# Patient Record
Sex: Female | Born: 1980 | Race: Black or African American | Hispanic: No | Marital: Single | Smoking: Never smoker
Health system: Southern US, Community
[De-identification: ages and names within clinical notes are randomized; demographics above are authoritative.]

## PROBLEM LIST (undated history)

## (undated) DIAGNOSIS — Z803 Family history of malignant neoplasm of breast: Secondary | ICD-10-CM

## (undated) DIAGNOSIS — D649 Anemia, unspecified: Secondary | ICD-10-CM

## (undated) DIAGNOSIS — N83209 Unspecified ovarian cyst, unspecified side: Secondary | ICD-10-CM

## (undated) DIAGNOSIS — N920 Excessive and frequent menstruation with regular cycle: Secondary | ICD-10-CM

## (undated) DIAGNOSIS — A64 Unspecified sexually transmitted disease: Secondary | ICD-10-CM

## (undated) DIAGNOSIS — Z8481 Family history of carrier of genetic disease: Secondary | ICD-10-CM

## (undated) HISTORY — PX: TUBAL LIGATION: SHX77

## (undated) HISTORY — DX: Family history of carrier of genetic disease: Z84.81

## (undated) HISTORY — DX: Family history of malignant neoplasm of breast: Z80.3

## (undated) HISTORY — DX: Unspecified sexually transmitted disease: A64

## (undated) HISTORY — DX: Excessive and frequent menstruation with regular cycle: N92.0

## (undated) HISTORY — DX: Unspecified ovarian cyst, unspecified side: N83.209

## (undated) HISTORY — DX: Anemia, unspecified: D64.9

---

## 2004-08-08 ENCOUNTER — Emergency Department: Payer: Self-pay | Admitting: Internal Medicine

## 2005-01-28 ENCOUNTER — Emergency Department: Payer: Self-pay | Admitting: Emergency Medicine

## 2006-02-22 ENCOUNTER — Emergency Department: Payer: Self-pay | Admitting: Unknown Physician Specialty

## 2006-03-19 ENCOUNTER — Emergency Department: Payer: Self-pay | Admitting: Emergency Medicine

## 2008-06-22 ENCOUNTER — Emergency Department: Payer: Self-pay | Admitting: Emergency Medicine

## 2010-07-16 ENCOUNTER — Emergency Department: Payer: Self-pay | Admitting: Emergency Medicine

## 2011-12-01 ENCOUNTER — Emergency Department: Payer: Self-pay | Admitting: Emergency Medicine

## 2012-06-23 ENCOUNTER — Emergency Department: Payer: Self-pay | Admitting: Emergency Medicine

## 2012-07-19 ENCOUNTER — Emergency Department: Payer: Self-pay | Admitting: Emergency Medicine

## 2012-12-14 DIAGNOSIS — N63 Unspecified lump in unspecified breast: Secondary | ICD-10-CM | POA: Insufficient documentation

## 2012-12-16 ENCOUNTER — Ambulatory Visit: Payer: Self-pay | Admitting: Family Medicine

## 2013-01-04 DIAGNOSIS — H6690 Otitis media, unspecified, unspecified ear: Secondary | ICD-10-CM | POA: Insufficient documentation

## 2014-06-19 ENCOUNTER — Emergency Department: Payer: Self-pay | Admitting: Emergency Medicine

## 2014-11-11 DIAGNOSIS — H6092 Unspecified otitis externa, left ear: Secondary | ICD-10-CM | POA: Insufficient documentation

## 2014-11-11 NOTE — ED Notes (Addendum)
Pt c/o cough, left earache, sore throat and soreness to ribs and chest. Pain increased with deep breaths and cough. Symptoms started this AM.

## 2014-11-12 ENCOUNTER — Encounter: Payer: Self-pay | Admitting: Emergency Medicine

## 2014-11-12 ENCOUNTER — Other Ambulatory Visit: Payer: Self-pay

## 2014-11-12 ENCOUNTER — Emergency Department
Admission: EM | Admit: 2014-11-12 | Discharge: 2014-11-12 | Disposition: A | Discharge status: Home / Self Care | Payer: Self-pay | Attending: Emergency Medicine | Admitting: Emergency Medicine

## 2014-11-12 ENCOUNTER — Emergency Department: Payer: Self-pay

## 2014-11-12 DIAGNOSIS — R079 Chest pain, unspecified: Secondary | ICD-10-CM

## 2014-11-12 DIAGNOSIS — H6092 Unspecified otitis externa, left ear: Secondary | ICD-10-CM

## 2014-11-12 DIAGNOSIS — R059 Cough, unspecified: Secondary | ICD-10-CM

## 2014-11-12 DIAGNOSIS — R05 Cough: Secondary | ICD-10-CM

## 2014-11-12 MED ORDER — IPRATROPIUM-ALBUTEROL 0.5-2.5 (3) MG/3ML IN SOLN
3.0000 mL | Freq: Once | RESPIRATORY_TRACT | Status: AC
  Administered 2014-11-12: 3 mL via RESPIRATORY_TRACT

## 2014-11-12 MED ORDER — ALBUTEROL SULFATE HFA 108 (90 BASE) MCG/ACT IN AERS
2.0000 | INHALATION_SPRAY | Freq: Four times a day (QID) | RESPIRATORY_TRACT | Status: DC
  Administered 2014-11-12: 2 via RESPIRATORY_TRACT
  Filled 2014-11-12: qty 6.7

## 2014-11-12 MED ORDER — IPRATROPIUM-ALBUTEROL 0.5-2.5 (3) MG/3ML IN SOLN
RESPIRATORY_TRACT | Status: AC
  Filled 2014-11-12: qty 3

## 2014-11-12 MED ORDER — IBUPROFEN 800 MG PO TABS
ORAL_TABLET | ORAL | Status: AC
  Administered 2014-11-12: 800 mg
  Filled 2014-11-12: qty 1

## 2014-11-12 MED ORDER — OXYCODONE-ACETAMINOPHEN 5-325 MG PO TABS: 1.0000 | ORAL_TABLET | ORAL | Status: DC | PRN

## 2014-11-12 MED ORDER — AMOXICILLIN-POT CLAVULANATE 875-125 MG PO TABS: 1.0000 | ORAL_TABLET | Freq: Two times a day (BID) | ORAL | Status: AC

## 2014-11-12 MED ORDER — IBUPROFEN 800 MG PO TABS: 800.0000 mg | ORAL_TABLET | Freq: Three times a day (TID) | ORAL | Status: DC | PRN

## 2014-11-12 NOTE — ED Notes (Signed)
Pt sleeping. 

## 2014-11-12 NOTE — ED Notes (Signed)
Pt updated on wait time. Pt verbalizes undertstanding.

## 2014-11-12 NOTE — ED Notes (Signed)
Pt updated on wait time. Pt verbalizes understanding. Pt sleeping in room prior to awoken for vital signs and pain assessment. Pt's daughter at bedside watching tv.

## 2014-11-12 NOTE — Discharge Instructions (Signed)
Use the ear drops witht e wick as demonstrated. Remember use 4-5 drops then add 4-5 drops to the wick another 3 x a day, changing the wick every day.   You can use augmentin 1 pill 2 x a day with food if the eardrops do not help after 2-3 days or if the ear gets worse.   Use the albuterol inhaler 2 puffs 4 x a day to help the cough.  Return for fever, shortness of breath or any worse pain. You can try the pain pills 1 pill 4 x a day for 1-2 days if needed. Usually motrin 1 pill 3 x a day works well for this.  Follow up with your doctor in the next week.

## 2014-11-12 NOTE — ED Notes (Signed)
Pt to stat registration c/o increased pain with coughing-center of chest; only hurts with cough; increased shortness of breath; has been feeling bad since early Saturday morning; nonproductive cough;

## 2014-11-12 NOTE — ED Provider Notes (Signed)
[facility] Emergency Department Provider Note  ____________________________________________  Time seen: Approximately 6:19 AM  I have reviewed the triage vital signs and the nursing notes.   HISTORY  Chief Complaint URI    HPI Patricia Yu is a 34 y.o. female patient reports she's been coughing for several days but then began drinking pain in her chest only with coughing yesterday evening he denies shortness of breath fever chills pain with deep breathing or any other complaints the pain she has is in the center of her chest over the breastbone again only with coughing she said initially it was very severe when I saw her she said it just hurt a little patient has not been on any long trips does not take birth control pills does not smoke has not had any broken bones and is otherwise doing well     History reviewed. No pertinent past medical history.  There are no active problems to display for this patient.   History reviewed. No pertinent past surgical history.  No current outpatient prescriptions on file.  Allergies Review of patient's allergies indicates no known allergies.  History reviewed. No pertinent family history.  Social History History  Substance Use Topics  . Smoking status: Never Smoker   . Smokeless tobacco: Not on file  . Alcohol Use: No    Review of Systems Constitutional: No fever/chills Eyes: No visual changes. ENT: No sore throat. Cardiovascular: Denies chest pain. Respiratory: Denies shortness of breath. Gastrointestinal: No abdominal pain.  No nausea, no vomiting.  No diarrhea.  No constipation. Genitourinary: Negative for dysuria. Musculoskeletal: Negative for back pain. Skin: Negative for rash. Neurological: Negative for headaches, focal weakness or numbness.   10-point ROS otherwise negative.  ____________________________________________   PHYSICAL EXAM:  VITAL SIGNS: ED Triage Vitals  Enc Vitals  Group     BP 11/11/14 2217 144/89 mmHg     Pulse Rate 11/11/14 2217 94     Resp 11/11/14 2217 16     Temp 11/11/14 2217 98.4 F (36.9 C)     Temp Source 11/11/14 2217 Oral     SpO2 11/11/14 2217 98 %     Weight 11/11/14 2217 145 lb (65.772 kg)     Height 11/11/14 2217  (1.6 m)     Head Cir --      Peak Flow --      Pain Score 11/11/14 2217 10     Pain Loc --      Pain Edu? --      Excl. in GC? --     Constitutional: Alert and oriented. Well appearing and in no acute distress. Eyes: Conjunctivae are normal. PERRL. EOMI. Head: Atraumatic. Nose: No congestion/rhinnorhea. Mouth/Throat: Mucous membranes are moist.  Oropharynx non-erythematous. Patient complains of pain on traction of the left ear or palpation of the ear and ear canal seems slightly swollen on that side from his partial obscured by wax but appears to be normal the other ear is normal completely Neck: No stridor.   Hematological/Lymphatic/Immunilogical: No cervical lymphadenopathy. Cardiovascular: Normal rate, regular rhythm. Grossly normal heart sounds.  Good peripheral circulation. Respiratory: Normal respiratory effort.  No retractions. Lungs CTAB. Gastrointestinal: Soft and nontender. No distention. No abdominal bruits. No CVA tenderness. Musculoskeletal: No lower extremity tenderness nor edema.  No joint effusions. Palpation of the chest does not reproduce any pain Neurologic:  Normal speech and language. No gross focal neurologic deficits are appreciated. Speech is normal. No gait instability. Skin:  Skin  is warm, dry and intact. No rash noted. Psychiatric: Mood and affect are normal. Speech and behavior are normal.  ____________________________________________   LABS (all labs ordered are listed, but only abnormal results are displayed)  Labs Reviewed - No data to display ____________________________________________  EKG is read by me shows normal sinus rhythm at 87 normal axis normal intervals and  essentially normal EKG ____________________________________________  RADIOLOGY  Radiology repeat chest x-ray is normal ____________________________________________   PROCEDURES  Procedure(s) performed: None  Critical Care performed: No  ____________________________________________   INITIAL IMPRESSION / ASSESSMENT AND PLAN / ED COURSE  Pertinent labs & imaging results that were available during my care of the patient were reviewed by me and considered in my medical decision making (see chart for details).  Patient had a albuterol Atrovent nebulizer this seems to have helped cough patient will be discharged on albuterol inhaler with spacer given ear drops and some antibiotics for her ear ____________________________________________   FINAL CLINICAL IMPRESSION(S) / ED DIAGNOSES  Final diagnoses:  Cough  Chest pain     Arnaldo NatalPaul F Zaliah Wissner, MD 11/13/14 [phone]

## 2014-11-12 NOTE — ED Notes (Signed)
No wheezing ausculated after duoneb. Pt with improved coughing.

## 2015-12-27 ENCOUNTER — Ambulatory Visit: Payer: Self-pay

## 2015-12-27 ENCOUNTER — Other Ambulatory Visit: Payer: Self-pay | Admitting: Internal Medicine

## 2015-12-27 DIAGNOSIS — R519 Headache, unspecified: Secondary | ICD-10-CM

## 2015-12-27 DIAGNOSIS — R51 Headache: Principal | ICD-10-CM

## 2015-12-28 ENCOUNTER — Ambulatory Visit
Admission: RE | Admit: 2015-12-28 | Discharge: 2015-12-28 | Disposition: A | Discharge status: Home / Self Care | Payer: Self-pay | Source: Ambulatory Visit | Attending: Internal Medicine | Admitting: Internal Medicine

## 2015-12-28 ENCOUNTER — Other Ambulatory Visit: Payer: Self-pay | Admitting: Internal Medicine

## 2015-12-28 DIAGNOSIS — R51 Headache: Principal | ICD-10-CM

## 2015-12-28 DIAGNOSIS — R519 Headache, unspecified: Secondary | ICD-10-CM

## 2016-01-22 DIAGNOSIS — A64 Unspecified sexually transmitted disease: Secondary | ICD-10-CM

## 2016-01-22 HISTORY — DX: Unspecified sexually transmitted disease: A64

## 2016-02-03 ENCOUNTER — Encounter: Payer: Self-pay | Admitting: Emergency Medicine

## 2016-02-03 ENCOUNTER — Emergency Department: Payer: BLUE CROSS/BLUE SHIELD

## 2016-02-03 ENCOUNTER — Emergency Department
Admission: EM | Admit: 2016-02-03 | Discharge: 2016-02-03 | Disposition: A | Discharge status: Home / Self Care | Payer: BLUE CROSS/BLUE SHIELD | Attending: Emergency Medicine | Admitting: Emergency Medicine

## 2016-02-03 DIAGNOSIS — R102 Pelvic and perineal pain: Secondary | ICD-10-CM | POA: Diagnosis not present

## 2016-02-03 DIAGNOSIS — A599 Trichomoniasis, unspecified: Secondary | ICD-10-CM | POA: Insufficient documentation

## 2016-02-03 DIAGNOSIS — R1031 Right lower quadrant pain: Secondary | ICD-10-CM | POA: Diagnosis present

## 2016-02-03 LAB — CBC
HCT: 37 % (ref 35.0–47.0)
Hemoglobin: 12.5 g/dL (ref 12.0–16.0)
MCH: 26.3 pg (ref 26.0–34.0)
MCHC: 33.8 g/dL (ref 32.0–36.0)
MCV: 77.8 fL — ABNORMAL LOW (ref 80.0–100.0)
Platelets: 234 10*3/uL (ref 150–440)
RBC: 4.76 MIL/uL (ref 3.80–5.20)
RDW: 14.5 % (ref 11.5–14.5)
WBC: 4.4 10*3/uL (ref 3.6–11.0)

## 2016-02-03 LAB — CHLAMYDIA/NGC RT PCR (ARMC ONLY)
Chlamydia Tr: NOT DETECTED
N gonorrhoeae: NOT DETECTED

## 2016-02-03 LAB — WET PREP, GENITAL
Clue Cells Wet Prep HPF POC: NONE SEEN
Sperm: NONE SEEN
Yeast Wet Prep HPF POC: NONE SEEN

## 2016-02-03 LAB — URINALYSIS COMPLETE WITH MICROSCOPIC (ARMC ONLY)
Bacteria, UA: NONE SEEN
Bilirubin Urine: NEGATIVE
Glucose, UA: NEGATIVE mg/dL
Hgb urine dipstick: NEGATIVE
Ketones, ur: NEGATIVE mg/dL
Nitrite: NEGATIVE
Protein, ur: NEGATIVE mg/dL
Specific Gravity, Urine: 1.015 (ref 1.005–1.030)
pH: 7 (ref 5.0–8.0)

## 2016-02-03 LAB — COMPREHENSIVE METABOLIC PANEL
ALT: 12 U/L — ABNORMAL LOW (ref 14–54)
AST: 17 U/L (ref 15–41)
Albumin: 3.7 g/dL (ref 3.5–5.0)
Alkaline Phosphatase: 52 U/L (ref 38–126)
Anion gap: 7 (ref 5–15)
BUN: 14 mg/dL (ref 6–20)
CO2: 24 mmol/L (ref 22–32)
Calcium: 9.3 mg/dL (ref 8.9–10.3)
Chloride: 107 mmol/L (ref 101–111)
Creatinine, Ser: 1.03 mg/dL — ABNORMAL HIGH (ref 0.44–1.00)
GFR calc Af Amer: >60 mL/min (ref >60)
GFR calc non Af Amer: >60 mL/min (ref >60)
Glucose, Bld: 107 mg/dL — ABNORMAL HIGH (ref 65–99)
Potassium: 3.3 mmol/L — ABNORMAL LOW (ref 3.5–5.1)
Sodium: 138 mmol/L (ref 135–145)
Total Bilirubin: 0.4 mg/dL (ref 0.3–1.2)
Total Protein: 7.3 g/dL (ref 6.5–8.1)

## 2016-02-03 LAB — LIPASE, BLOOD: Lipase: 21 U/L (ref 11–51)

## 2016-02-03 LAB — PREGNANCY, URINE: Preg Test, Ur: NEGATIVE

## 2016-02-03 MED ORDER — IBUPROFEN 600 MG PO TABS
600.0000 mg | ORAL_TABLET | Freq: Once | ORAL | Status: AC
  Administered 2016-02-03: 600 mg via ORAL
  Filled 2016-02-03: qty 1

## 2016-02-03 MED ORDER — ONDANSETRON 4 MG PO TBDP: 4.0000 mg | ORAL_TABLET | Freq: Four times a day (QID) | ORAL | 0 refills | Status: DC | PRN

## 2016-02-03 MED ORDER — METRONIDAZOLE 500 MG PO TABS: 500.0000 mg | ORAL_TABLET | Freq: Three times a day (TID) | ORAL | 0 refills | Status: DC

## 2016-02-03 MED ORDER — MORPHINE SULFATE (PF) 2 MG/ML IV SOLN
2.0000 mg | Freq: Once | INTRAVENOUS | Status: AC
  Administered 2016-02-03: 2 mg via INTRAVENOUS
  Filled 2016-02-03: qty 1

## 2016-02-03 MED ORDER — DIATRIZOATE MEGLUMINE & SODIUM 66-10 % PO SOLN
15.0000 mL | Freq: Once | ORAL | Status: AC
  Administered 2016-02-03: 15 mL via ORAL

## 2016-02-03 MED ORDER — MORPHINE SULFATE (PF) 2 MG/ML IV SOLN
2.0000 mg | INTRAVENOUS | Status: DC | PRN
Start: 2016-02-03 — End: 2016-02-03

## 2016-02-03 MED ORDER — MORPHINE SULFATE (PF) 4 MG/ML IV SOLN
4.0000 mg | Freq: Once | INTRAVENOUS | Status: AC
  Administered 2016-02-03: 4 mg via INTRAVENOUS

## 2016-02-03 MED ORDER — ONDANSETRON HCL 4 MG/2ML IJ SOLN
4.0000 mg | Freq: Once | INTRAMUSCULAR | Status: AC
  Administered 2016-02-03: 4 mg via INTRAVENOUS
  Filled 2016-02-03: qty 2

## 2016-02-03 MED ORDER — MORPHINE SULFATE (PF) 4 MG/ML IV SOLN
INTRAVENOUS | Status: AC
  Filled 2016-02-03: qty 1

## 2016-02-03 MED ORDER — IOPAMIDOL (ISOVUE-300) INJECTION 61%
100.0000 mL | Freq: Once | INTRAVENOUS | Status: AC | PRN
  Administered 2016-02-03: 100 mL via INTRAVENOUS

## 2016-02-03 NOTE — ED Provider Notes (Signed)
[facility] Emergency Department Provider Note   ____________________________________________   First MD Initiated Contact with Patient 02/03/16 1632     (approximate)  I have reviewed the triage vital signs and the nursing notes.   HISTORY  Chief Complaint Abdominal Pain and Back Pain    HPI Yetta GlassmanBianca M Delarocha is a 35 y.o. female reports she's been having pain in the right lower abdomen for the last day. She reports it's a fairly constant discomfort in the right lower abdomen that slowly worsening. Occasionally there are sharp twinges of pain in the right lower pelvis that radiate towards the back, but mostly a persistent discomfort. She had a fever and felt chills last night, but did not measure her temperature.  Change in urination. No blood in the urine. Previous tubal ligation. No vaginal discharge or discomfort.  Was seen in urgent care and they're concerned that she may need to come get further evaluated the ER.  Report her pain is moderate, right lower abdomen. Worse with walking.   History reviewed. No pertinent past medical history.  There are no active problems to display for this patient.   History reviewed. No pertinent surgical history.  Prior to Admission medications   Medication Sig Start Date End Date Taking? Authorizing Provider  metroNIDAZOLE (FLAGYL) 500 MG tablet Take 1 tablet (500 mg total) by mouth 3 (three) times daily. 02/03/16   Sharyn CreamerMark Lavren Lewan, MD  ondansetron (ZOFRAN ODT) 4 MG disintegrating tablet Take 1 tablet (4 mg total) by mouth every 6 (six) hours as needed for nausea or vomiting. 02/03/16   Sharyn CreamerMark Javia Dillow, MD    Allergies Review of patient's allergies indicates no known allergies.  History reviewed. No pertinent family history.  Social History Social History  Substance Use Topics  . Smoking status: Never Smoker  . Smokeless tobacco: Never Used  . Alcohol use No    Review of Systems Constitutional: No recorded  fever but felt chilled last night and questionably had a fever. Eyes: No visual changes. ENT: No sore throat. Cardiovascular: Denies chest pain. Respiratory: Denies shortness of breath. Gastrointestinal: See history of present illness No nausea, no vomiting.  No diarrhea.  No constipation. Genitourinary: Negative for dysuria. Reports slight white vaginal discharge. Musculoskeletal: Negative for back pain. Skin: Negative for rash. Neurological: Negative for headaches, focal weakness or numbness.  10-point ROS otherwise negative.  ____________________________________________   PHYSICAL EXAM:  VITAL SIGNS: ED Triage Vitals  Enc Vitals Group     BP 02/03/16 1559 (!) 156/86     Pulse Rate 02/03/16 1559 87     Resp 02/03/16 1559 18     Temp 02/03/16 1559 98.4 F (36.9 C)     Temp Source 02/03/16 1559 Oral     SpO2 02/03/16 1559 100 %     Weight 02/03/16 1601 160 lb (72.6 kg)     Height 02/03/16 1601 5\' 3"  (1.6 m)     Head Circumference --      Peak Flow --      Pain Score 02/03/16 1601 8     Pain Loc --      Pain Edu? --      Excl. in GC? --     Constitutional: Alert and oriented. Well appearing and in no acute distress. Eyes: Conjunctivae are normal. PERRL. EOMI. Head: Atraumatic. Nose: No congestion/rhinnorhea. Mouth/Throat: Mucous membranes are moist.  Oropharynx non-erythematous. Neck: No stridor.   Cardiovascular: Normal rate, regular rhythm. Grossly normal heart sounds.  Good peripheral  circulation. Respiratory: Normal respiratory effort.  No retractions. Lungs CTAB. Gastrointestinal: Soft and nontender except for modest discomfort suprapubically and in the right lower quadrant without rebound or guarding. No distention. No abdominal bruits. No CVA tenderness. Negative Murphy. Musculoskeletal: No lower extremity tenderness nor edema.  No joint effusions. Gynecologic exam performed with nurse, Eileen Stanford. The patient has a normal external exam. The cervix appears normal, but  there is a slight white discharge present in the vault. There is no cervical motion tenderness. The patient does report mild discomfort to palpation in the right adnexa without obvious mass noted. Neurologic:  Normal speech and language. No gross focal neurologic deficits are appreciated.Skin:  Skin is warm, dry and intact. No rash noted. Psychiatric: Mood and affect are normal. Speech and behavior are normal.  ____________________________________________   LABS (all labs ordered are listed, but only abnormal results are displayed)  Labs Reviewed  WET PREP, GENITAL - Abnormal; Notable for the following:       Result Value   Trich, Wet Prep PRESENT (*)    WBC, Wet Prep HPF POC FEW (*)    All other components within normal limits  COMPREHENSIVE METABOLIC PANEL - Abnormal; Notable for the following:    Potassium 3.3 (*)    Glucose, Bld 107 (*)    Creatinine, Ser 1.03 (*)    ALT 12 (*)    All other components within normal limits  CBC - Abnormal; Notable for the following:    MCV 77.8 (*)    All other components within normal limits  URINALYSIS COMPLETEWITH MICROSCOPIC ([facility] ONLY) - Abnormal; Notable for the following:    Color, Urine YELLOW (*)    APPearance CLEAR (*)    Leukocytes, UA 2+ (*)    Squamous Epithelial / LPF 0-5 (*)    All other components within normal limits  CHLAMYDIA/NGC RT PCR ([facility] ONLY)  LIPASE, BLOOD  PREGNANCY, URINE   ____________________________________________  EKG    ____________________________________________  RADIOLOGY  Ct Abdomen Pelvis W Contrast  Result Date: 02/03/2016 CLINICAL DATA:  Abdominal pain with fevers EXAM: CT ABDOMEN AND PELVIS WITH CONTRAST TECHNIQUE: Multidetector CT imaging of the abdomen and pelvis was performed using the standard protocol following bolus administration of intravenous contrast. CONTRAST:  ISOVUE-300 IOPAMIDOL (ISOVUE-300) INJECTION 61% COMPARISON:  None. FINDINGS: Lower chest:  No acute  findings. Hepatobiliary: No masses or other significant abnormality. Pancreas: No mass, inflammatory changes, or other significant abnormality. Spleen: Within normal limits in size and appearance. Adrenals/Urinary Tract: There is a small 1 cm soft tissue lesion arising from the medial limb of the right adrenal gland consistent with small adenoma. Some scarring is noted in the kidneys bilaterally. A cyst is noted in the lower pole of the left kidney measuring 1 cm. It demonstrates some dependent calcifications. No obstructive changes are noted. Stomach/Bowel: No evidence of obstruction, inflammatory process, or abnormal fluid collections. The appendix is within normal limits. Vascular/Lymphatic: No pathologically enlarged lymph nodes. No evidence of abdominal aortic aneurysm. Reproductive: Bilateral ovarian cysts are noted. The largest of these on the left measures 3.7 cm. On the right the largest cyst measures 3.8 cm Other: Small fat containing umbilical hernia is noted. Musculoskeletal:  No suspicious bone lesions identified. IMPRESSION: Bilateral ovarian cysts as described. No free fluid to suggest cyst rupture is noted. Normal-appearing appendix. Changes in the kidneys bilaterally as described. Small adrenal lesion on the right likely representing an adenoma. Electronically Signed   By: Alcide Clever M.D.   On:  02/03/2016 19:09    ____________________________________________   PROCEDURES  Procedure(s) performed: None  Procedures  Critical Care performed: No  ____________________________________________   INITIAL IMPRESSION / ASSESSMENT AND PLAN / ED COURSE  Pertinent labs & imaging results that were available during my care of the patient were reviewed by me and considered in my medical decision making (see chart for details).  Differential diagnosis includes but is not limited to, abdominal perforation, aortic dissection, cholecystitis, appendicitis, diverticulitis, colitis,  esophagitis/gastritis, kidney stone, pyelonephritis, urinary tract infection, aortic aneurysm. All are considered in decision and treatment plan. Based upon the patient's presentation and risk factors, and the patient's report of pain involving the right lower abdomen I'm concerned about the possibility for either primarily appendicitis or ovarian process such as ovarian cyst, or potentially with the patient's slight discharge acute bacterial vaginosis, trichomoniasis, or other acute gynecologic disease. Her clinical examination does not appear to suggest pelvic inflammatory disease, and she has no cervical motion tenderness.  CT scan negative for appendicitis. Lab work does not show leukocytosis or evidence to support an obvious appendicitis. We'll proceed with ultrasound to evaluate for ovarian torsion, cyst, or potential infection involving gynecologic process. Overall my suspicion for torsion is low, but will follow with arteriovenous duplex to further evaluate for normal flow.  Clinical Course   ----------------------------------------- 9:00 PM on 02/03/2016 ----------------------------------------- Ongoing care assigned to Dr. Inocencio Homes. Follow-up on ultrasounds of pelvis/ovaries. Gonorrhea chlamydia is pending, and the patient will be likely discharged if no acute ovarian process/concern noted on ultrasound. We'll provide a prescription for Flagyl, and close follow-up with Dr. Valentino Saxon of gynecology. Appears likely trichomoniasis.   ____________________________________________   FINAL CLINICAL IMPRESSION(S) / ED DIAGNOSES  Final diagnoses:  Trichomonosis  Pelvic pain in female      NEW MEDICATIONS STARTED DURING THIS VISIT:  New Prescriptions   METRONIDAZOLE (FLAGYL) 500 MG TABLET    Take 1 tablet (500 mg total) by mouth 3 (three) times daily.   ONDANSETRON (ZOFRAN ODT) 4 MG DISINTEGRATING TABLET    Take 1 tablet (4 mg total) by mouth every 6 (six) hours as needed for nausea or vomiting.      Note:  This document was prepared using Dragon voice recognition software and may include unintentional dictation errors.     Sharyn Creamer, MD 02/03/16 2104

## 2016-02-03 NOTE — ED Triage Notes (Signed)
Pt presents to ED from urgent care c/o abdominal and back pain that started last night. Pain is described as sharp pain . States she has had no relief. Pt had a "fever with chills " last night but did not take temp. Pain 8/10

## 2016-02-03 NOTE — ED Provider Notes (Signed)
-----------------------------------------   10:38 PM on 02/03/2016 -----------------------------------------  I assume care of this patient from Dr. Fanny BienQuale at 9 PM pending results of the ultrasound which shows bilateral ovarian cysts, no torsion. Discussed this with the patient, we discussed meticulous return precautions and she is comfortable with the discharge plan. We discussed treatment of Trichomonas and testing of sexual partners and she voices understanding. She will follow-up with Dr. Valentino Saxonherry.   Pelvic ultrasound IMPRESSION: 1. Negative for ovarian torsion. Normal ultrasound appearance of the uterus. 2. Simple 3.9 cm right ovarian cyst and hemorrhagic 3.4 cm left ovarian cyst. No follow-up indicated for cysts of this size in reproductive age patients. Trace free fluid in the cul-de-sac also most likely is physiologic. This recommendation follows the consensus statement: Management of Asymptomatic Ovarian and Other Adnexal Cysts Imaged at US: Society of Radiologists in Ultrasound Consensus Conference Statement. Radiology 2010; [id number].   Gayla DossEryka A Starla Deller, MD 02/03/16 2241

## 2016-02-03 NOTE — ED Notes (Signed)
Patient transported to Ultrasound 

## 2016-02-03 NOTE — Discharge Instructions (Signed)
You were seen in the emergency room for abdominal pain. It is important that you follow up closely with your primary care doctor or obstetrician in the next couple of days.  Also, a small cyst was noted around the right kidney, he'll need to have this followed up through your primary care doctor on a nonemergent basis.  Take antibiotic as prescribed.  Please return to the emergency room right away if you are to develop a fever, severe nausea, your pain becomes severe or worsens, you are unable to keep food down, begin vomiting any dark or bloody fluid, you develop any dark or bloody stools, feel dehydrated, or other new concerns or symptoms arise.   No driving this evening and he received morphine in the emergency room.

## 2016-02-03 NOTE — ED Notes (Signed)
MD Quale at bedside. 

## 2016-02-03 NOTE — ED Notes (Signed)
  Reviewed d/c instructions, follow-up care, and prescriptions with pt. Pt verbalized understanding 

## 2016-02-05 LAB — URINE CULTURE: Culture: 80000 — AB

## 2016-02-07 ENCOUNTER — Encounter: Payer: Self-pay | Admitting: Obstetrics and Gynecology

## 2016-02-14 ENCOUNTER — Encounter: Payer: Self-pay | Admitting: Obstetrics and Gynecology

## 2016-02-14 ENCOUNTER — Ambulatory Visit (INDEPENDENT_AMBULATORY_CARE_PROVIDER_SITE_OTHER): Payer: BLUE CROSS/BLUE SHIELD | Admitting: Obstetrics and Gynecology

## 2016-02-14 VITALS — BP 124/81 | HR 97 | Ht 63.0 in | Wt 178.4 lb

## 2016-02-14 DIAGNOSIS — N83201 Unspecified ovarian cyst, right side: Secondary | ICD-10-CM | POA: Diagnosis not present

## 2016-02-14 DIAGNOSIS — N83202 Unspecified ovarian cyst, left side: Secondary | ICD-10-CM | POA: Diagnosis not present

## 2016-02-14 DIAGNOSIS — R102 Pelvic and perineal pain: Secondary | ICD-10-CM

## 2016-02-14 DIAGNOSIS — Z8481 Family history of carrier of genetic disease: Secondary | ICD-10-CM

## 2016-02-14 DIAGNOSIS — N946 Dysmenorrhea, unspecified: Secondary | ICD-10-CM

## 2016-02-14 LAB — POCT URINALYSIS DIPSTICK
Bilirubin, UA: NEGATIVE
Glucose, UA: NEGATIVE
Ketones, UA: NEGATIVE
Leukocytes, UA: NEGATIVE
Nitrite, UA: NEGATIVE
Spec Grav, UA: 1.02
Urobilinogen, UA: 0.2
pH, UA: 6

## 2016-02-14 NOTE — Patient Instructions (Signed)
1. Laparoscopy with peritoneal biopsies and possible ovarian cystectomy is scheduled 2. Return for preoperative appointment 3. Continue taking Advil for pain relief; may add Tylenol to that regimen   Diagnostic Laparoscopy A diagnostic laparoscopy is a procedure to diagnose diseases in the abdomen. During the procedure, a thin, lighted, pencil-sized instrument called a laparoscope is inserted into the abdomen through an incision. The laparoscope allows your health care provider to look at the organs inside your body. LET [facility] CARE PROVIDER KNOW ABOUT:  Any allergies you have.  All medicines you are taking, including vitamins, herbs, eye drops, creams, and over-the-counter medicines.  Previous problems you or members of your family have had with the use of anesthetics.  Any blood disorders you have.  Previous surgeries you have had.  Medical conditions you have. RISKS AND COMPLICATIONS  Generally, this is a safe procedure. However, problems can occur, which may include:  Infection.  Bleeding.  Damage to other organs.  Allergic reaction to the anesthetics used during the procedure. BEFORE THE PROCEDURE  Do not eat or drink anything after midnight on the night before the procedure or as directed by your health care provider.  Ask your health care provider about:  Changing or stopping your regular medicines.  Taking medicines such as aspirin and ibuprofen. These medicines can thin your blood. Do not take these medicines before your procedure if your health care provider instructs you not to.  Plan to have someone take you home after the procedure. PROCEDURE  You may be given a medicine to help you relax (sedative).  You will be given a medicine to make you sleep (general anesthetic).  Your abdomen will be inflated with a gas. This will make your organs easier to see.  Small incisions will be made in your abdomen.  A laparoscope and other small instruments will be  inserted into the abdomen through the incisions.  A tissue sample may be removed from an organ in the abdomen for examination.  The instruments will be removed from the abdomen.  The gas will be released.  The incisions will be closed with stitches (sutures). AFTER THE PROCEDURE  Your blood pressure, heart rate, breathing rate, and blood oxygen level will be monitored often until the medicines you were given have worn off.   This information is not intended to replace advice given to you by your health care provider. Make sure you discuss any questions you have with your health care provider.   Document Released: 09/15/2000 Document Revised: 02/28/2015 Document Reviewed: 01/20/2014 Elsevier Interactive Patient Education [employer]   Endometriosis Endometriosis is a condition in which the tissue that lines the uterus (endometrium) grows outside of its normal location. The tissue may grow in many locations close to the uterus, but it commonly grows on the ovaries, fallopian tubes, vagina, or bowel. Because the uterus expels, or sheds, its lining every menstrual cycle, there is bleeding wherever the endometrial tissue is located. This can cause pain because blood is irritating to tissues not normally exposed to it.  CAUSES  The cause of endometriosis is not known.  SIGNS AND SYMPTOMS  Often, there are no symptoms. When symptoms are present, they can vary with the location of the displaced tissue. Various symptoms can occur at different times. Although symptoms occur mainly during a woman's menstrual period, they can also occur midcycle and usually stop with menopause. Some people may go months with no symptoms at all. Symptoms may include:   Back or abdominal  pain.   Heavier bleeding during periods.   Pain during intercourse.   Painful bowel movements.   Infertility. DIAGNOSIS  Your health care provider will do a physical exam and ask about your symptoms. Various tests  may be done, such as:   Blood tests and urine tests. These are done to help rule out other problems.   Ultrasound. This test is done to look for abnormal tissue.   An X-ray of the lower bowel (barium enema).  Laparoscopy. In this procedure, a thin, lighted tube with a tiny camera on the end (laparoscope) is inserted into your abdomen. This helps your health care provider look for abnormal tissue to confirm the diagnosis. The health care provider may also remove a small piece of tissue (biopsy) from any abnormal tissue found. This tissue sample can then be sent to a lab so it can be looked at under a microscope. TREATMENT  Treatment will vary and may include:   Medicines to relieve pain. Nonsteroidal anti-inflammatory drugs (NSAIDs) are a type of pain medicine that can help to relieve the pain caused by endometriosis.  Hormonal therapy. When using hormonal therapy, periods are eliminated. This eliminates the monthly exposure to blood by the displaced endometrial tissue.   Surgery. Surgery may sometimes be done to remove the abnormal endometrial tissue. In severe cases, surgery may be done to remove the fallopian tubes, uterus, and ovaries (hysterectomy). HOME CARE INSTRUCTIONS   Take all medicines as directed by your health care provider. Do not take aspirin because it may increase bleeding when you are not on hormonal therapy.   Avoid activities that produce pain, including sexual activity. SEEK MEDICAL CARE IF:  You have pelvic pain before, after, or during your periods.  You have pelvic pain between periods that gets worse during your period.  You have pelvic pain during or after sex.  You have pelvic pain with bowel movements or urination, especially during your period.  You have problems getting pregnant.  You have a fever. SEEK IMMEDIATE MEDICAL CARE IF:   Your pain is severe and is not responding to pain medicine.   You have severe nausea and vomiting, or you  cannot keep foods down.   You have pain that is limited to the right lower part of your abdomen.   You have swelling or increasing pain in your abdomen.   You see blood in your stool.  MAKE SURE YOU:   Understand these instructions.  Will watch your condition.  Will get help right away if you are not doing well or get worse.   This information is not intended to replace advice given to you by your health care provider. Make sure you discuss any questions you have with your health care provider.   Document Released: 06/06/2000 Document Revised: 06/30/2014 Document Reviewed: 02/04/2013 Elsevier Interactive Patient Education [employer] Elsevier Inc.

## 2016-02-14 NOTE — Progress Notes (Signed)
GYN ENCOUNTER NOTE  Subjective:       Patricia Yu is a 35 y.o. G90P3003 female is here for gynecologic evaluation of the following issues:  1. ER follow-up 2. Right lower quadrant pain 3. Bilateral ovarian cysts  Recent ER evaluation was notable for patient had a 3.2 cm pedunculated right ovarian cyst and a 3.4 cm hemorrhagic left ovarian cyst. Patient was diagnosed with Trichomonas and subsequently has been treated. Patient does report chronic right lower quadrant pain and right flank pain. The pain is stabbing in nature and lasts 4-5 minutes when it occurs. There are no obvious exacerbating or alleviating factors. She does take 800 mg of Advil without relief of this discomfort.  Menarche is age 41. Intervals are monthly. Duration of flow is 4-6 days of moderate bleeding. Dysmenorrhea-severe 8/10. Patient does report central cramping. She does not have low back pain with radiation. She does not report deep thrusting dyspareunia. She does report perimenstrual leg discomfort/fatigue which resolves after onset of menses Patient reports that her menses have gotten more severe after her tubal ligation.  Bowel function is normal. Bladder function is normal.  Patient is BRCA negative (mom is BRCA positive).     Gynecologic History Patient's last menstrual period was 02/13/2016. Contraception: tubal ligation  Obstetric History OB History  Gravida Para Term Preterm AB Living  3 3 3     3   SAB TAB Ectopic Multiple Live Births          3    # Outcome Date GA Lbr Len/2nd Weight Sex Delivery Anes PTL Lv  3 Term 2003   6 lb 2.4 oz (2.79 kg) F Vag-Spont   LIV  2 Term 2000   6 lb (2.722 kg) F Vag-Spont   LIV  1 Term 1999   9 lb 2.4 oz (4.15 kg) M Vag-Spont   LIV      Past Medical History:  Diagnosis Date  . Anemia   . Heavy periods   . Ovarian cyst   . STD (sexually transmitted disease) 01/2016   trich    Past Surgical History:  Procedure Laterality Date  . TUBAL LIGATION       No current outpatient prescriptions on file prior to visit.   No current facility-administered medications on file prior to visit.     No Known Allergies  Social History   Social History  . Marital status: Single    Spouse name: N/A  . Number of children: N/A  . Years of education: N/A   Occupational History  . Not on file.   Social History Main Topics  . Smoking status: Never Smoker  . Smokeless tobacco: Never Used  . Alcohol use No  . Drug use: No  . Sexual activity: Yes    Birth control/ protection: Surgical   Other Topics Concern  . Not on file   Social History Narrative  . No narrative on file    Family History  Problem Relation Age of Onset  . Breast cancer Mother   . Heart disease Mother   . Heart disease Father   . Ovarian cancer Neg Hx   . Colon cancer Neg Hx   . Diabetes Neg Hx     The following portions of the patient's history were reviewed and updated as appropriate: allergies, current medications, past family history, past medical history, past social history, past surgical history and problem list.  Review of Systems Review of Systems - per history of present illness  Objective:   BP 124/81   Pulse 97   Ht 5' 3"  (1.6 m)   Wt 178 lb 6.4 oz (80.9 kg)   LMP 02/13/2016 Comment: neg preg  BMI 31.60 kg/m  CONSTITUTIONAL: Well-developed, well-nourished female in no acute distress.  HENT:  Normocephalic, atraumatic.  NECK: Normal range of motion, supple, no masses.  Normal thyroid.  SKIN: Skin is warm and dry. No rash noted. Not diaphoretic. No erythema. No pallor. [place]: Alert and oriented to person, place, and time. PSYCHIATRIC: Normal mood and affect. Normal behavior. Normal judgment and thought content. CARDIOVASCULAR:Not Examined RESPIRATORY: Not Examined BREASTS: Not Examined ABDOMEN: Soft, non distended; Non tender.  No Organomegaly. PELVIC:  External Genitalia: Normal  BUS: Normal  Vagina: Normal; Menstrual blood in  vault  Cervix: Normal; no lesions; 2/4 cervical motion tenderness  Uterus: Anteverted, mobile, tender 2/4, top normal size  Adnexa: Right adnexa tender 2/4 without palpable mass; left adnexa nonpalpable nontender  RV: Normal external exam  Bladder: Nontender MUSCULOSKELETAL: Normal range of motion. No tenderness.  No cyanosis, clubbing, or edema.     Assessment:   1. Pelvic pain in female - POCT urinalysis dipstick - Urine culture   2. Dysmenorrhea  3. Chronic right lower quadrant/right flank pain  4. Bilateral ovarian cysts  5. Family history BRCA positive (mom)  20. Suspect endometriosis  Plan:   1.Schedule laparoscopy with peritoneal biopsies to rule out endometriosis; possible ovarian cystectomy 2. Return for preop appointment 3. Information regarding laparoscopy and endometriosis is given 4. Continue taking ibuprofen for pain. Advised Tylenol as needed for pain is well  A total of 30 minutes were spent face-to-face with the patient during the encounter with greater than 50% dealing with counseling and coordination of care.  Brayton Mars, MD  Note: This dictation was prepared with Dragon dictation along with smaller phrase technology. Any transcriptional errors that result from this process are unintentional.

## 2016-02-15 LAB — URINE CULTURE

## 2016-03-27 ENCOUNTER — Encounter: Payer: BLUE CROSS/BLUE SHIELD | Admitting: Obstetrics and Gynecology

## 2016-03-27 ENCOUNTER — Inpatient Hospital Stay: Admission: RE | Admit: 2016-03-27 | Payer: BLUE CROSS/BLUE SHIELD | Source: Ambulatory Visit

## 2016-03-31 ENCOUNTER — Encounter: Admission: RE | Payer: Self-pay | Source: Ambulatory Visit

## 2016-03-31 ENCOUNTER — Ambulatory Visit
Admission: RE | Admit: 2016-03-31 | Payer: BLUE CROSS/BLUE SHIELD | Source: Ambulatory Visit | Admitting: Obstetrics and Gynecology

## 2016-03-31 SURGERY — LAPAROSCOPY, DIAGNOSTIC
Anesthesia: General

## 2016-04-22 ENCOUNTER — Encounter: Payer: Self-pay | Admitting: Obstetrics and Gynecology

## 2016-04-22 ENCOUNTER — Ambulatory Visit (INDEPENDENT_AMBULATORY_CARE_PROVIDER_SITE_OTHER): Payer: BLUE CROSS/BLUE SHIELD | Admitting: Obstetrics and Gynecology

## 2016-04-22 VITALS — BP 127/77 | HR 105 | Ht 63.0 in | Wt 173.5 lb

## 2016-04-22 DIAGNOSIS — R21 Rash and other nonspecific skin eruption: Secondary | ICD-10-CM

## 2016-04-22 MED ORDER — HYDROCORTISONE 1 % EX CREA: 1.0000 "application " | TOPICAL_CREAM | Freq: Two times a day (BID) | CUTANEOUS | 0 refills | Status: AC

## 2016-04-22 NOTE — Progress Notes (Signed)
GYNECOLOGY PROGRESS NOTE  Subjective:    Patient ID: Patricia Yu, female    DOB: February 02, 1981, 35 y.o.   MRN: [id number]  HPI  Patient is a 35 y.o. G45P3003 female who presents with complaints of leg rash x 1 week. Rash is described as itchy (more at night than during the day) and irritating. She reports that she was seen last week by [facility], who prescribed Diflucan and ketoconazole shampoo with no relief.  Denies vaginal discharge, odor, or itching.   The following portions of the patient's history were reviewed and updated as appropriate: allergies, current medications, past family history, past medical history, past social history, past surgical history and problem list.  Review of Systems Pertinent items noted in HPI and remainder of comprehensive ROS otherwise negative.   Objective:   Blood pressure 127/77, pulse (!) 105, height 5\' 3"  (1.6 m), weight 173 lb 8 oz (78.7 kg), last menstrual period 04/18/2016. General appearance: alert and no distress Pelvic: external genitalia normal and rash noted in bilateral groin regions, hyperpigmented regions with areas of excoriations Extremities: extremities normal, atraumatic, no cyanosis or edema. No skin lesions.    Assessment:   Groin rash  Plan:   - Rash does not appear to be caused by yeast.  Advised to stop shampoo.  Patient has one more Diflucan, can take if needed. Prescribed Hydrocortisone cream to apply to the area twice daily x 1 week.  Also advised on cornstarch powder to the area for prevention of moisture in groin folds.  - RTC if symptoms worsen or do not improve.    Hildred Laser, MD [facility]

## 2016-05-20 ENCOUNTER — Encounter: Payer: Self-pay | Admitting: Emergency Medicine

## 2016-05-20 ENCOUNTER — Emergency Department
Admission: EM | Admit: 2016-05-20 | Discharge: 2016-05-21 | Disposition: A | Discharge status: Home / Self Care | Payer: BLUE CROSS/BLUE SHIELD | Attending: Emergency Medicine | Admitting: Emergency Medicine

## 2016-05-20 DIAGNOSIS — Z79899 Other long term (current) drug therapy: Secondary | ICD-10-CM | POA: Diagnosis not present

## 2016-05-20 DIAGNOSIS — Y929 Unspecified place or not applicable: Secondary | ICD-10-CM | POA: Diagnosis not present

## 2016-05-20 DIAGNOSIS — W268XXA Contact with other sharp object(s), not elsewhere classified, initial encounter: Secondary | ICD-10-CM | POA: Diagnosis not present

## 2016-05-20 DIAGNOSIS — S61213A Laceration without foreign body of left middle finger without damage to nail, initial encounter: Secondary | ICD-10-CM | POA: Diagnosis present

## 2016-05-20 DIAGNOSIS — Y9389 Activity, other specified: Secondary | ICD-10-CM | POA: Diagnosis not present

## 2016-05-20 DIAGNOSIS — Y999 Unspecified external cause status: Secondary | ICD-10-CM | POA: Diagnosis not present

## 2016-05-20 DIAGNOSIS — Z23 Encounter for immunization: Secondary | ICD-10-CM | POA: Diagnosis not present

## 2016-05-20 MED ORDER — TETANUS-DIPHTH-ACELL PERTUSSIS 5-2.5-18.5 LF-MCG/0.5 IM SUSP
0.5000 mL | Freq: Once | INTRAMUSCULAR | Status: AC
  Administered 2016-05-21: 0.5 mL via INTRAMUSCULAR
  Filled 2016-05-20: qty 0.5

## 2016-05-20 MED ORDER — MELOXICAM 7.5 MG PO TABS: 7.5000 mg | ORAL_TABLET | Freq: Every day | ORAL | 0 refills | Status: AC

## 2016-05-20 MED ORDER — BACITRACIN-NEOMYCIN-POLYMYXIN 400-5-5000 EX OINT
TOPICAL_OINTMENT | CUTANEOUS | Status: AC
  Administered 2016-05-21: 1
  Filled 2016-05-20: qty 1

## 2016-05-20 NOTE — ED Triage Notes (Signed)
Patient ambulatory to triage with steady gait, without difficulty or distress noted; pt reports cutting left middle finger while opening up a can PTA

## 2016-05-20 NOTE — ED Provider Notes (Signed)
[facility] Emergency Department Provider Note  ____________________________________________  Time seen: Approximately 11:35 PM  I have reviewed the triage vital signs and the nursing notes.   HISTORY  Chief Complaint Laceration   HPI Patricia Yu is a 35 y.o. female  that presents after cutting finger on can. Patient denies any other injuries. Patient is not sure when last tetanus shot was.  Past Medical History:  Diagnosis Date  . Anemia   . Heavy periods   . Ovarian cyst   . STD (sexually transmitted disease) 01/2016   trich    There are no active problems to display for this patient.   Past Surgical History:  Procedure Laterality Date  . TUBAL LIGATION      Prior to Admission medications   Medication Sig Start Date End Date Taking? Authorizing Provider  hydrocortisone cream 1 % Apply 1 application topically 2 (two) times daily. 04/22/16   Hildred LaserAnika Cherry, MD  meloxicam (MOBIC) 7.5 MG tablet Take 1 tablet (7.5 mg total) by mouth daily. 05/20/16 05/20/17  Enid DerryAshley Ayman Brull, PA-C    Allergies Patient has no known allergies.  Family History  Problem Relation Age of Onset  . Breast cancer Mother   . Heart disease Mother   . Heart disease Father   . Ovarian cancer Neg Hx   . Colon cancer Neg Hx   . Diabetes Neg Hx     Social History Social History  Substance Use Topics  . Smoking status: Never Smoker  . Smokeless tobacco: Never Used  . Alcohol use No    Review of Systems  Constitutional: Negative for fever/chills Cardiovascular: Negative for chest pain.  Respiratory: Negative for shortness of breath. Musculoskeletal: Positive for pain. Skin: Mild bleeding at laceration site. No swelling.   ____________________________________________   PHYSICAL EXAM:  VITAL SIGNS: ED Triage Vitals  Enc Vitals Group     BP 05/20/16 2215 (!) 124/100     Pulse Rate 05/20/16 2215 (!) 105     Resp 05/20/16 2215 20     Temp 05/20/16 2215 97  F (36.1 C)     Temp Source 05/20/16 2215 Oral     SpO2 05/20/16 2215 100 %     Weight 05/20/16 2213 172 lb (78 kg)     Height 05/20/16 2213 5\' 3"  (1.6 m)     Head Circumference --      Peak Flow --      Pain Score 05/20/16 2213 6     Pain Loc --      Pain Edu? --      Excl. in GC? --      Constitutional: Alert and oriented. Well appearing and in no acute distress. Eyes: Conjunctivae are normal. EOMI. Nose: No congestion/rhinnorhea. Mouth/Throat: Mucous membranes are moist.   Neck: No stridor. Lymphatic: No cervical lymphadenopathy. Cardiovascular: Good peripheral circulation. Respiratory: Normal respiratory effort.  No retractions.  Musculoskeletal: FROM throughout. Neurologic:  Normal speech and language. No gross focal neurologic deficits are appreciated. Skin:  Superficial skin tear over left middle finger. Bleeding ceased.   ____________________________________________   LABS (all labs ordered are listed, but only abnormal results are displayed)  Labs Reviewed - No data to display ____________________________________________  EKG   RADIOLOGY    PROCEDURES  Procedure(s) performed:  ____________________________________________   INITIAL IMPRESSION / ASSESSMENT AND PLAN / ED COURSE  Clinical Course     Pertinent labs & imaging results that were available during my care of the patient were reviewed  by me and considered in my medical decision making (see chart for details).  Patient has superficial skin tear. No indication for sutures or Steri-Strips. Patient was instructed to follow-up with primary care as needed. Patient can take meloxicam for pain. Splint was given as to not injure area further. Neosporin was applied. Tetanus was updated. Patient was instructed to follow-up with emergency department for symptoms that change or worsen or signs of infection.  ___________________________________   FINAL CLINICAL IMPRESSION(S) / ED DIAGNOSES  Final  diagnoses:  Laceration of left middle finger without foreign body without damage to nail, initial encounter    New Prescriptions   MELOXICAM (MOBIC) 7.5 MG TABLET    Take 1 tablet (7.5 mg total) by mouth daily.    Note:  This document was prepared using Dragon voice recognition software and may include unintentional dictation errors.   Enid DerryAshley Venita Seng, PA-C 05/21/16 0006    Rockne MenghiniAnne-Caroline Norman, MD 05/26/16 [phone]

## 2019-02-09 ENCOUNTER — Other Ambulatory Visit: Payer: Self-pay

## 2019-02-09 DIAGNOSIS — Z20822 Contact with and (suspected) exposure to covid-19: Secondary | ICD-10-CM

## 2019-02-10 LAB — NOVEL CORONAVIRUS, NAA: SARS-CoV-2, NAA: NOT DETECTED

## 2019-05-06 ENCOUNTER — Other Ambulatory Visit: Payer: Self-pay

## 2019-05-06 DIAGNOSIS — Z20822 Contact with and (suspected) exposure to covid-19: Secondary | ICD-10-CM

## 2019-05-09 LAB — NOVEL CORONAVIRUS, NAA: SARS-CoV-2, NAA: NOT DETECTED

## 2019-08-25 ENCOUNTER — Ambulatory Visit: Payer: Self-pay | Attending: Internal Medicine

## 2019-08-25 DIAGNOSIS — Z23 Encounter for immunization: Secondary | ICD-10-CM | POA: Insufficient documentation

## 2019-08-25 NOTE — Progress Notes (Signed)
   Covid-19 Vaccination Clinic  Name:  Patricia Yu    MRN: [id number] DOB: August 02, 1980  08/25/2019  [name] was observed post Covid-19 immunization for 15 minutes without incident. She was provided with Vaccine Information Sheet and instruction to access the V-Safe system.   [name] was instructed to call 911 with any severe reactions post vaccine: Marland Kitchen Difficulty breathing  . Swelling of face and throat  . A fast heartbeat  . A bad rash all over body  . Dizziness and weakness   Immunizations Administered    Name Date Dose VIS Date Route   Pfizer COVID-19 Vaccine 08/25/2019  8:13 AM 0.3 mL 06/03/2019 Intramuscular   Manufacturer: [employer], [employer]   Lot: [id number]   NDC: [address]-5

## 2019-09-15 ENCOUNTER — Other Ambulatory Visit: Payer: Self-pay

## 2019-09-15 ENCOUNTER — Ambulatory Visit: Payer: Self-pay | Attending: Internal Medicine

## 2019-09-15 DIAGNOSIS — Z23 Encounter for immunization: Secondary | ICD-10-CM

## 2019-09-15 NOTE — Progress Notes (Signed)
   Covid-19 Vaccination Clinic  Name:  Patricia Yu    MRN: [id number] DOB: 1981/06/09  09/15/2019  [name] was observed post Covid-19 immunization for 15 minutes without incident. She was provided with Vaccine Information Sheet and instruction to access the V-Safe system.   [name] was instructed to call 911 with any severe reactions post vaccine: Marland Kitchen Difficulty breathing  . Swelling of face and throat  . A fast heartbeat  . A bad rash all over body  . Dizziness and weakness   Immunizations Administered    Name Date Dose VIS Date Route   Pfizer COVID-19 Vaccine 09/15/2019  8:29 AM 0.3 mL 06/03/2019 Intramuscular   Manufacturer: [employer], [employer]   Lot: [id number]   NDC: [address]-8

## 2019-10-20 ENCOUNTER — Ambulatory Visit (HOSPITAL_BASED_OUTPATIENT_CLINIC_OR_DEPARTMENT_OTHER): Payer: Self-pay | Admitting: Genetic Counselor

## 2019-10-20 DIAGNOSIS — Z803 Family history of malignant neoplasm of breast: Secondary | ICD-10-CM

## 2019-10-20 DIAGNOSIS — Z8481 Family history of carrier of genetic disease: Secondary | ICD-10-CM

## 2019-10-21 ENCOUNTER — Encounter: Payer: Self-pay | Admitting: Genetic Counselor

## 2019-10-21 DIAGNOSIS — Z803 Family history of malignant neoplasm of breast: Secondary | ICD-10-CM | POA: Insufficient documentation

## 2019-10-21 DIAGNOSIS — Z8481 Family history of carrier of genetic disease: Secondary | ICD-10-CM | POA: Insufficient documentation

## 2019-10-21 NOTE — Progress Notes (Signed)
REFERRING PROVIDER: No referring provider defined for this encounter.  PRIMARY PROVIDER:  Elgie Collard, MD  PRIMARY REASON FOR VISIT:  1. Family history of BRCA1 gene positive   2. Family history of breast cancer     I connected with Ms. Perdomo on 10/20/2019 at 1:00 pm EDT by MyChart video conference and verified that I am speaking with the correct person using two identifiers.   Patient location: Work Provider location: [employer] office  HISTORY OF PRESENT ILLNESS:   Patricia Yu, a 39 y.o. female, was seen for a [place] cancer genetics consultation due to a family history of a BRCA1 mutation in her mother. Ms. Westall presents to clinic today to discuss the possibility of a hereditary predisposition to cancer, genetic testing, and to further clarify her future cancer risks, as well as potential cancer risks for family members.   Ms. Boehning does not have a personal history of cancer.    RISK FACTORS:  Menarche was at age 4.  First live birth at age 73.  OCP use for approximately 3 years.  Ovaries intact: yes.  Hysterectomy: no.  Menopausal status: premenopausal.  HRT use: 0 years. Colonoscopy: n/a. Mammogram within the last year: no. Number of breast biopsies: 0. Any excessive radiation exposure in the past: no  Past Medical History:  Diagnosis Date  . Anemia   . Family history of BRCA1 gene positive   . Family history of breast cancer   . Heavy periods   . Ovarian cyst   . STD (sexually transmitted disease) 01/2016   trich    Past Surgical History:  Procedure Laterality Date  . TUBAL LIGATION      Social History   Socioeconomic History  . Marital status: Single    Spouse name: Not on file  . Number of children: Not on file  . Years of education: Not on file  . Highest education level: Not on file  Occupational History  . Not on file  Tobacco Use  . Smoking status: Never Smoker  . Smokeless tobacco: Never Used  Substance and Sexual Activity   . Alcohol use: No  . Drug use: No  . Sexual activity: Yes    Birth control/protection: Surgical  Other Topics Concern  . Not on file  Social History Narrative  . Not on file   Social Determinants of Health   Financial Resource Strain:   . Difficulty of Paying Living Expenses:   Food Insecurity:   . Worried About Charity fundraiser in the Last Year:   . Arboriculturist in the Last Year:   Transportation Needs:   . Film/video editor (Medical):   Marland Kitchen Lack of Transportation (Non-Medical):   Physical Activity:   . Days of Exercise per Week:   . Minutes of Exercise per Session:   Stress:   . Feeling of Stress :   Social Connections:   . Frequency of Communication with Friends and Family:   . Frequency of Social Gatherings with Friends and Family:   . Attends Religious Services:   . Active Member of Clubs or Organizations:   . Attends Archivist Meetings:   Marland Kitchen Marital Status:      FAMILY HISTORY:  We obtained a detailed, 4-generation family history.  Significant diagnoses are listed below: Family History  Problem Relation Age of Onset  . Breast cancer Mother 66       diagnosed in other breast age 46  . Heart disease Mother   .  BRCA 1/2 Mother   . Heart disease Father   . Cancer Brother 7       unknown type  . Breast cancer Maternal Aunt        diagnosed younger than 75  . Cancer Maternal Uncle        unknown type  . Breast cancer Maternal Grandmother   . Cancer Maternal Great-grandmother        unknown type  . Ovarian cancer Neg Hx   . Colon cancer Neg Hx   . Diabetes Neg Hx    Ms. Eidem has one son (age 79) and two daughters (ages 30 and 24). She has one brother who was diagnosed with cancer at the age of 52, although she does not know what type of cancer he had.  Ms. Schmierer's mother, Alexandrea Westergard, is 85 and has a history of breast cancer diagnosed in both breasts - first in the left breast at age 23, then in the right breast at age 37. Her  mother underwent genetic testing in 2013 which revealed a mutation in the BRCA1 gene called p.Y101X. Ms. Gilham has one maternal aunt and one maternal uncle. Her aunt died in her 80s or 31s and had a history of breast cancer diagnosed when she was younger than 67. Her uncle has a history of cancer, although Ms. Bessinger is not sure what type of cancer he had or how old he was when it was diagnosed. Her maternal grandmother had a history of breast cancer (unknown age of diagnosis), and her great-grandmother (maternal grandmother's mother) had cancer. She does not have information about her maternal grandfather.  Ms. Grandinetti's father died at the age of 49 and had heart problems. She has three paternal aunts and one paternal uncle, none of whom have had cancer to her knowledge. Her paternal grandparents died when they were older than 73. There are no known diagnoses of cancer on the paternal side of the family.  Ms. Ent is aware of previous family history of genetic testing for hereditary cancer risks in her mother, who is BRCA1 positive. She is Research scientist (physical sciences) with [place] and [place] ancestry on her maternal side, and White and Native [place] on her paternal side. There is no reported Ashkenazi Jewish ancestry. There is no known consanguinity.  GENETIC COUNSELING ASSESSMENT: Ms. Schey is a 39 y.o. female with a family history of a known pathogenic variant in the BRCA1 gene in her mother, which confers a 50% risk for Ms. Rasp to have inherited the BRCA1 variant. We, therefore, discussed and recommended the following at today's visit.   DISCUSSION:  We discussed that Ms. Farish has a 50% (1 in 2) chance to have inherited the BRCA1 variant that was discovered in her mother. We reviewed the cancer risks that are associated with BRCA1 mutations, including an increased risk of breast, ovarian, prostate, and pancreatic cancer. Studies show that women with a BRCA1 mutation can  have a 40-87% lifetime risk to develop breast cancer, a 40-60% lifetime risk to develop a secondary breast cancer, and up to a 36-53% risk to develop ovarian cancer. Men can have a 1-2% lifetime risk to develop female breast cancer and an increased risk for prostate cancer. Both men and women can also have a 2-3% increased risk for pancreatic cancer. Individuals with a BRCA1 mutation may opt for increased cancer screening and/or risk-reduction measures for associated cancer risks per the NCCN guidelines.  We discussed that testing is beneficial for multiple reasons,  including knowing about potential cancer risks and identifying screening and risk-reduction options that may be appropriate.  We reviewed the characteristics, features and inheritance patterns of hereditary cancer syndromes. We also discussed genetic testing, including the appropriate family members to test, the process of testing, insurance coverage, genetic discrimination, and turn-around-time for results. We discussed the implications of a negative, positive, and variant of uncertain significance result.  We recommended Ms. Tufo pursue genetic testing for the known familial variant in BRCA1, called p.Y101X, through [facility].    Based on Ms. Buhl's family history of a known pathogenic variant in [place], she meets medical criteria for genetic testing. Despite that she meets criteria, she may still have an out of pocket cost. We discussed that if her estimated out of pocket cost for testing is over $100, the laboratory will let her know and may offer alternative payment or payment assistance options. If the out of pocket cost of testing is less than $100 she will be billed by the genetic testing laboratory.   Lastly, we discussed that some people do not want to undergo genetic testing due to fear of genetic discrimination.  A federal law called the Genetic Information Non-Discrimination Act (GINA) of 2008 helps protect  individuals against genetic discrimination based on their genetic test results.  It impacts both health insurance and employment.  With health insurance, it protects against increased premiums, being kicked off insurance or being forced to take a test in order to be insured.  For employment it protects against hiring, firing and promoting decisions based on genetic test results.  Health status due to a cancer diagnosis is not protected under GINA.  Additionally, life, disability, and long-term care insurance is not protected under GINA.   PLAN: After considering the risks, benefits, and limitations, Ms. Sobh provided informed consent to pursue genetic testing and the saliva sample will be sent to [facility] for analysis of the known familial variant in BRCA1. Results should be available within approximately two-three weeks' time, at which point they will be disclosed by telephone to Ms. Dorrance, as will any additional recommendations warranted by these results. Ms. Zuluaga will receive a summary of her genetic counseling visit and a copy of her results once available. This information will also be available in Epic.   Ms. Pitz questions were answered to her satisfaction today. Our contact information was provided should additional questions or concerns arise. Thank you for the referral and allowing [place] to share in the care of your patient.   Clint Guy, [place], [facility] Licensed, Certified Dispensing [email or web address] Phone: [phone]  The patient was seen for a total of 40 minutes in face-to-face genetic counseling.  This patient was discussed with Drs. Magrinat, Lindi Adie and/or Burr Medico who agrees with the above.    _______________________________________________________________________ For Office Staff:  Number of people involved in session: 1 Was an Intern/ student involved with case: no

## 2019-11-02 ENCOUNTER — Telehealth: Payer: Self-pay | Admitting: Genetic Counselor

## 2019-11-02 NOTE — Telephone Encounter (Signed)
Called to obtain current insurance information to provide to the genetic testing laboratory ([facility]). Ms. Pinkett will email a copy of her updated insurance card.

## 2019-11-25 ENCOUNTER — Encounter: Payer: Self-pay | Admitting: Genetic Counselor

## 2019-11-25 ENCOUNTER — Telehealth: Payer: Self-pay | Admitting: Genetic Counselor

## 2019-11-25 DIAGNOSIS — Z1501 Genetic susceptibility to malignant neoplasm of breast: Secondary | ICD-10-CM | POA: Insufficient documentation

## 2019-11-25 DIAGNOSIS — Z1509 Genetic susceptibility to other malignant neoplasm: Secondary | ICD-10-CM

## 2019-11-25 DIAGNOSIS — Z1379 Encounter for other screening for genetic and chromosomal anomalies: Secondary | ICD-10-CM | POA: Insufficient documentation

## 2019-11-25 HISTORY — DX: Genetic susceptibility to malignant neoplasm of breast: Z15.09

## 2019-11-25 HISTORY — DX: Genetic susceptibility to malignant neoplasm of breast: Z15.01

## 2019-11-25 NOTE — Telephone Encounter (Signed)
Disclosed positive genetic test results - Genetic testing revealed that Patricia Yu inherited the pathogenic variant in the BRCA1 gene, called [id number].[id number], that was previously detected in her mother. 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 We have scheduled a virtual follow-up appointment to discuss these results in greater detail on 12/02/19 at 11am.

## 2019-12-02 ENCOUNTER — Inpatient Hospital Stay: Payer: Self-pay | Attending: Genetic Counselor | Admitting: Genetic Counselor

## 2019-12-06 ENCOUNTER — Telehealth: Payer: Self-pay | Admitting: Genetic Counselor

## 2019-12-06 NOTE — Telephone Encounter (Signed)
LVM requesting that she call back to reschedule her genetic counseling appointment to review her genetic test results. 

## 2019-12-15 NOTE — Telephone Encounter (Signed)
LVM requesting that she call back to reschedule her genetic counseling appointment to review her genetic test results.

## 2021-08-28 ENCOUNTER — Other Ambulatory Visit: Payer: Self-pay | Admitting: Internal Medicine

## 2021-08-28 DIAGNOSIS — Z1231 Encounter for screening mammogram for malignant neoplasm of breast: Secondary | ICD-10-CM

## 2021-10-03 ENCOUNTER — Ambulatory Visit
Admission: RE | Admit: 2021-10-03 | Discharge: 2021-10-03 | Disposition: A | Discharge status: Home / Self Care | Payer: Managed Care, Other (non HMO) | Source: Ambulatory Visit | Attending: Internal Medicine | Admitting: Internal Medicine

## 2021-10-03 DIAGNOSIS — Z1231 Encounter for screening mammogram for malignant neoplasm of breast: Secondary | ICD-10-CM | POA: Insufficient documentation

## 2021-10-04 ENCOUNTER — Other Ambulatory Visit: Payer: Self-pay | Admitting: Internal Medicine

## 2021-10-07 ENCOUNTER — Other Ambulatory Visit: Payer: Self-pay | Admitting: Internal Medicine

## 2021-10-10 ENCOUNTER — Other Ambulatory Visit: Payer: Self-pay | Admitting: Internal Medicine

## 2021-10-10 DIAGNOSIS — R928 Other abnormal and inconclusive findings on diagnostic imaging of breast: Secondary | ICD-10-CM

## 2021-10-10 DIAGNOSIS — N6489 Other specified disorders of breast: Secondary | ICD-10-CM

## 2021-10-15 ENCOUNTER — Ambulatory Visit
Admission: RE | Admit: 2021-10-15 | Discharge: 2021-10-15 | Disposition: A | Discharge status: Home / Self Care | Payer: Managed Care, Other (non HMO) | Source: Ambulatory Visit | Attending: Internal Medicine | Admitting: Internal Medicine

## 2021-10-15 DIAGNOSIS — N6489 Other specified disorders of breast: Secondary | ICD-10-CM

## 2021-10-15 DIAGNOSIS — R928 Other abnormal and inconclusive findings on diagnostic imaging of breast: Secondary | ICD-10-CM

## 2022-11-18 IMAGING — US US BREAST*L* LIMITED INC AXILLA
1 series · 8 of 8 positions shown · non-contrast
Comparison: Previous exam(s).

CLINICAL DATA: Recall from screening mammography, possible
asymmetry in the slight upper breast at posterior depth visible only
on the MLO view.

EXAM:
DIGITAL DIAGNOSTIC UNILATERAL LEFT MAMMOGRAM WITH TOMOSYNTHESIS AND
CAD; ULTRASOUND LEFT BREAST LIMITED
TECHNIQUE: Left digital diagnostic mammography and breast tomosynthesis was
performed. The images were evaluated with computer-aided detection.;
Targeted ultrasound examination of the left breast was performed.

[Series 1: us breast*left* limited inc axilla · 0.07mm/px · 8 of 8 slices shown]
[im 1/8]
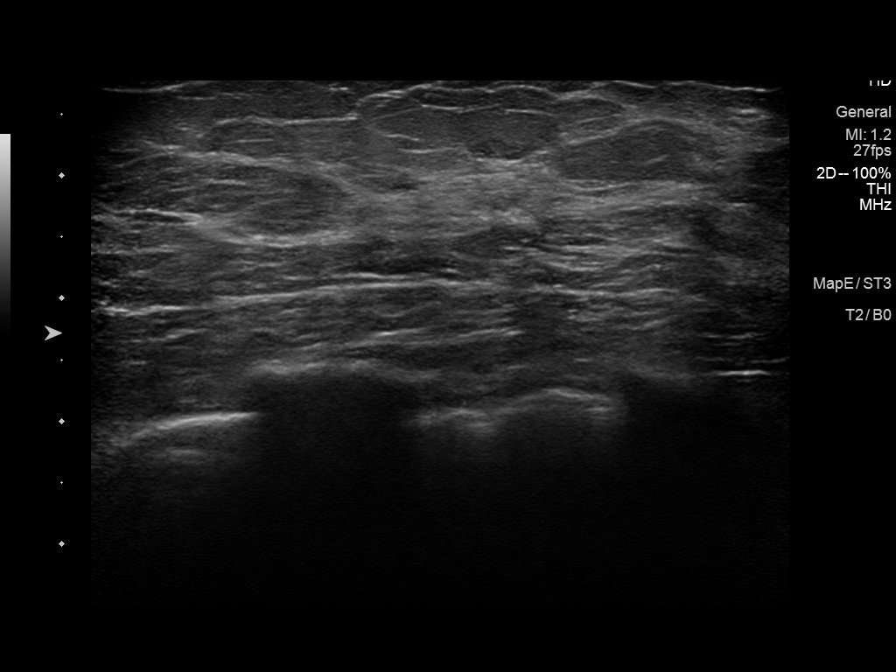
[im 2/8]
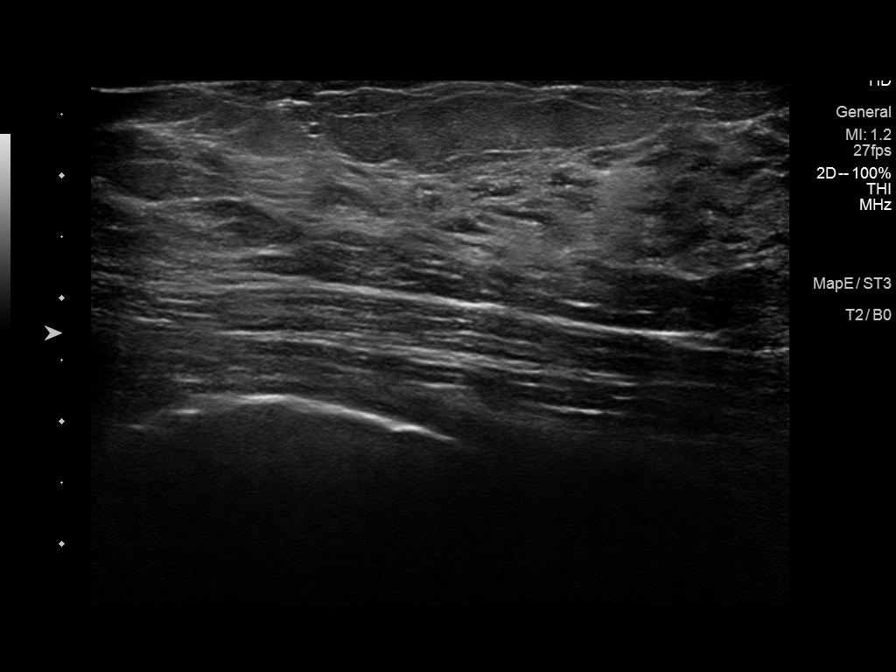
[im 3/8]
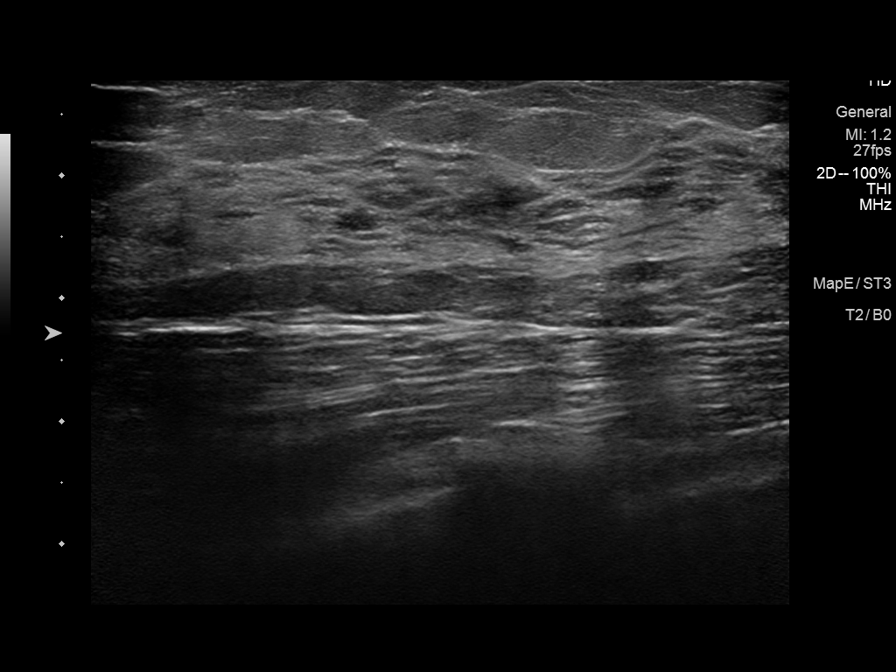
[im 4/8]
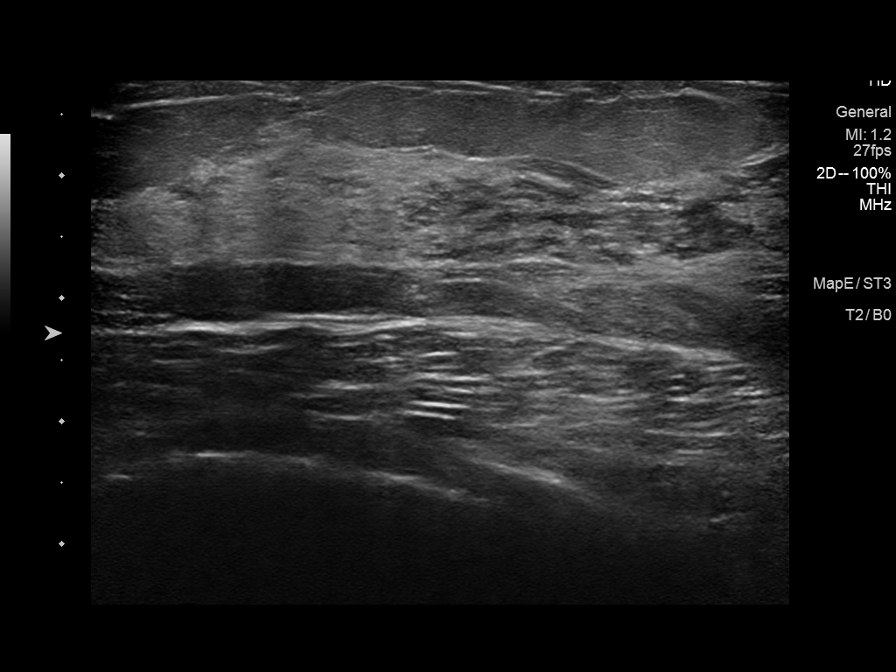
[im 5/8]
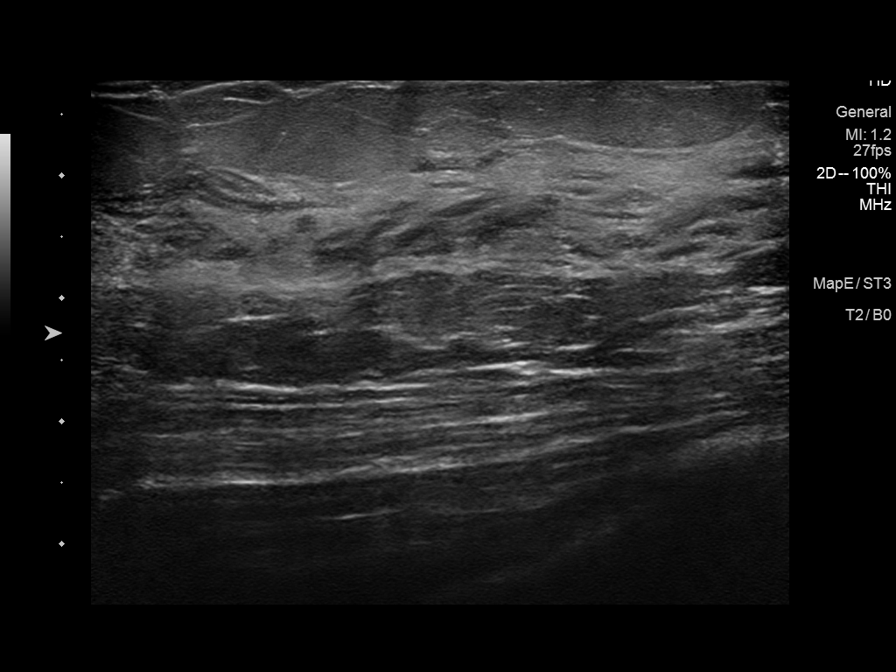
[im 6/8]
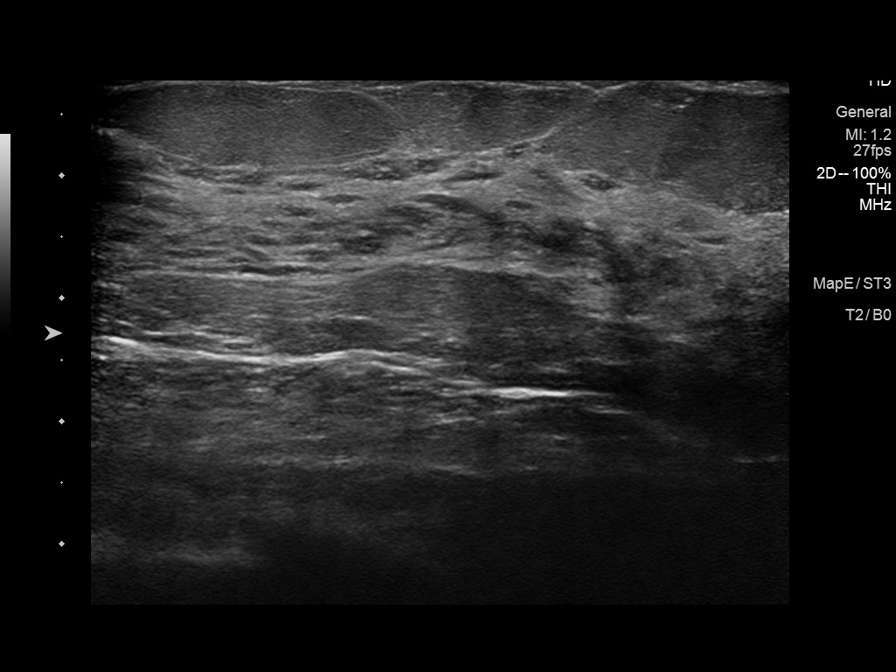
[im 7/8]
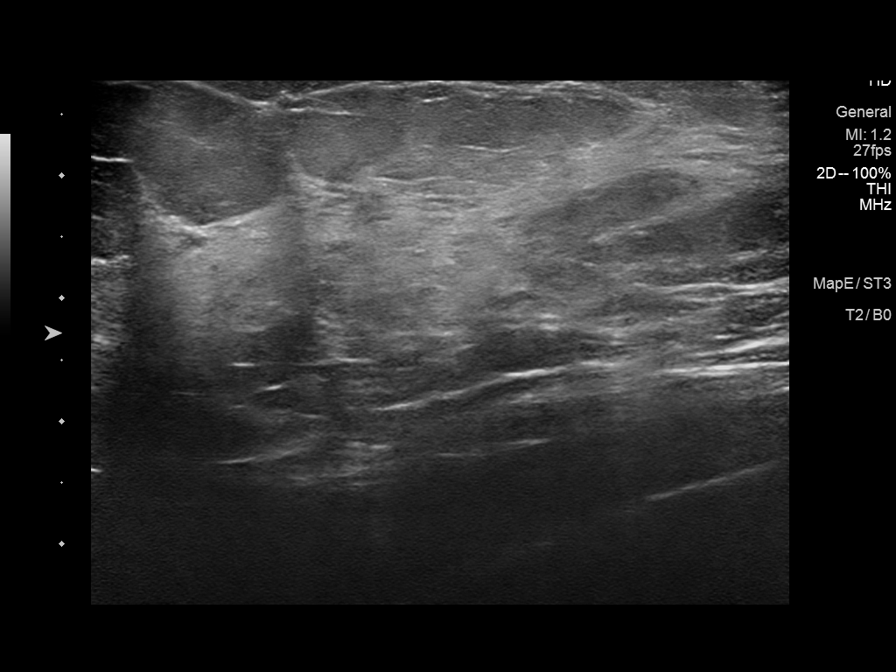
[im 8/8]
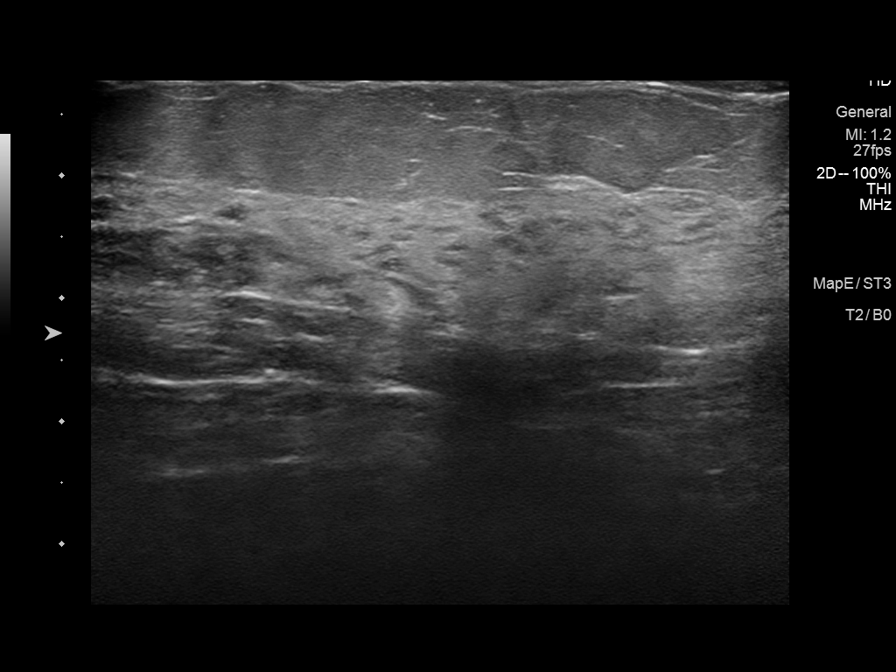

[8 of 8 positions shown; findings below may reference images not displayed]

ACR Breast Density Category c: The breast tissue is heterogeneously
dense, which may obscure small masses.
FINDINGS: Spot-compression MLO view of the area of concern and a full field
mediolateral view were obtained.

The asymmetry in the upper breast at posterior depth persists but
partially disperses with compression, likely indicating an island of
fibroglandular tissue. On the full field mediolateral view, the
asymmetry is in a similar location when compared to its location on
the MLO view, indicating that it is at or near 12 o'clock. On the
full field mediolateral view, the asymmetry has the appearance of an
island of fibroglandular tissue.

Targeted ultrasound is performed, demonstrating normal
fibroglandular tissue throughout the upper breast with imaging from
10:30 o'clock through 12 o'clock to 1:30 o'clock. No cyst, solid
mass or abnormal acoustic shadowing is identified. There is an
island of fibroglandular tissue at the 10:30 o'clock position 4 cm
from nipple at posterior depth which has a similar morphology to the
mammographic finding.
IMPRESSION: No mammographic or sonographic evidence of malignancy involving the
LEFT breast.

RECOMMENDATION:
Screening mammogram in one year.(Code:X7-3-TNT)

I have discussed the findings and recommendations with the patient.
If applicable, a reminder letter will be sent to the patient
regarding the next appointment.

BI-RADS CATEGORY  1: Negative.

## 2022-11-18 IMAGING — MG MM DIGITAL DIAGNOSTIC UNILAT*L* W/ TOMO W/ CAD
4 series · 4 of 12 positions shown · non-contrast
Comparison: Previous exam(s).

CLINICAL DATA: Recall from screening mammography, possible
asymmetry in the slight upper breast at posterior depth visible only
on the MLO view.

EXAM:
DIGITAL DIAGNOSTIC UNILATERAL LEFT MAMMOGRAM WITH TOMOSYNTHESIS AND
CAD; ULTRASOUND LEFT BREAST LIMITED
TECHNIQUE: Left digital diagnostic mammography and breast tomosynthesis was
performed. The images were evaluated with computer-aided detection.;
Targeted ultrasound examination of the left breast was performed.

[L ML synth-2D]
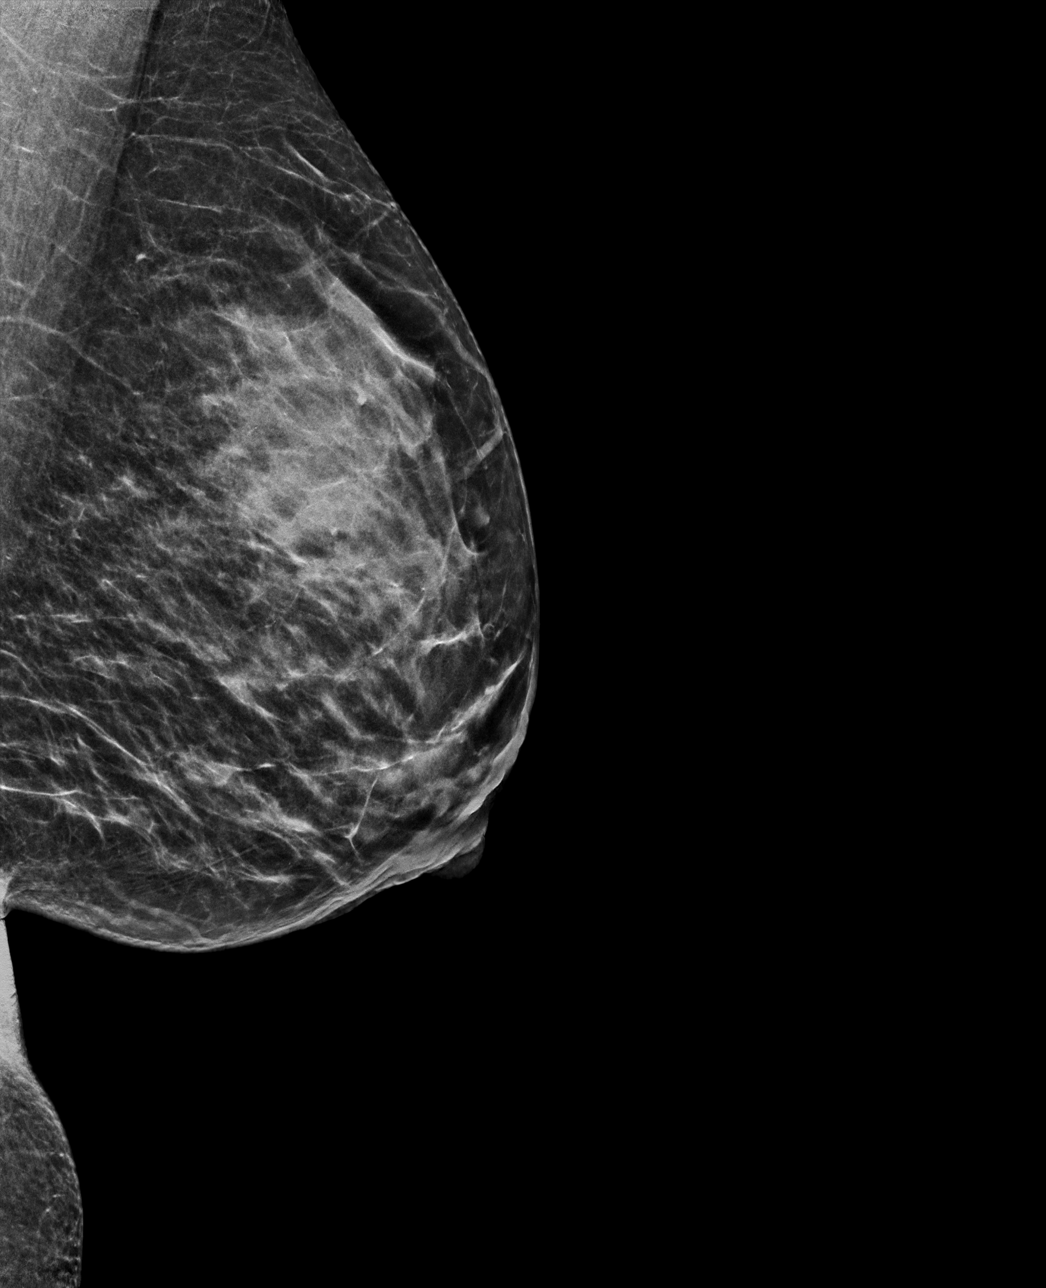

[L MLO synth-2D]
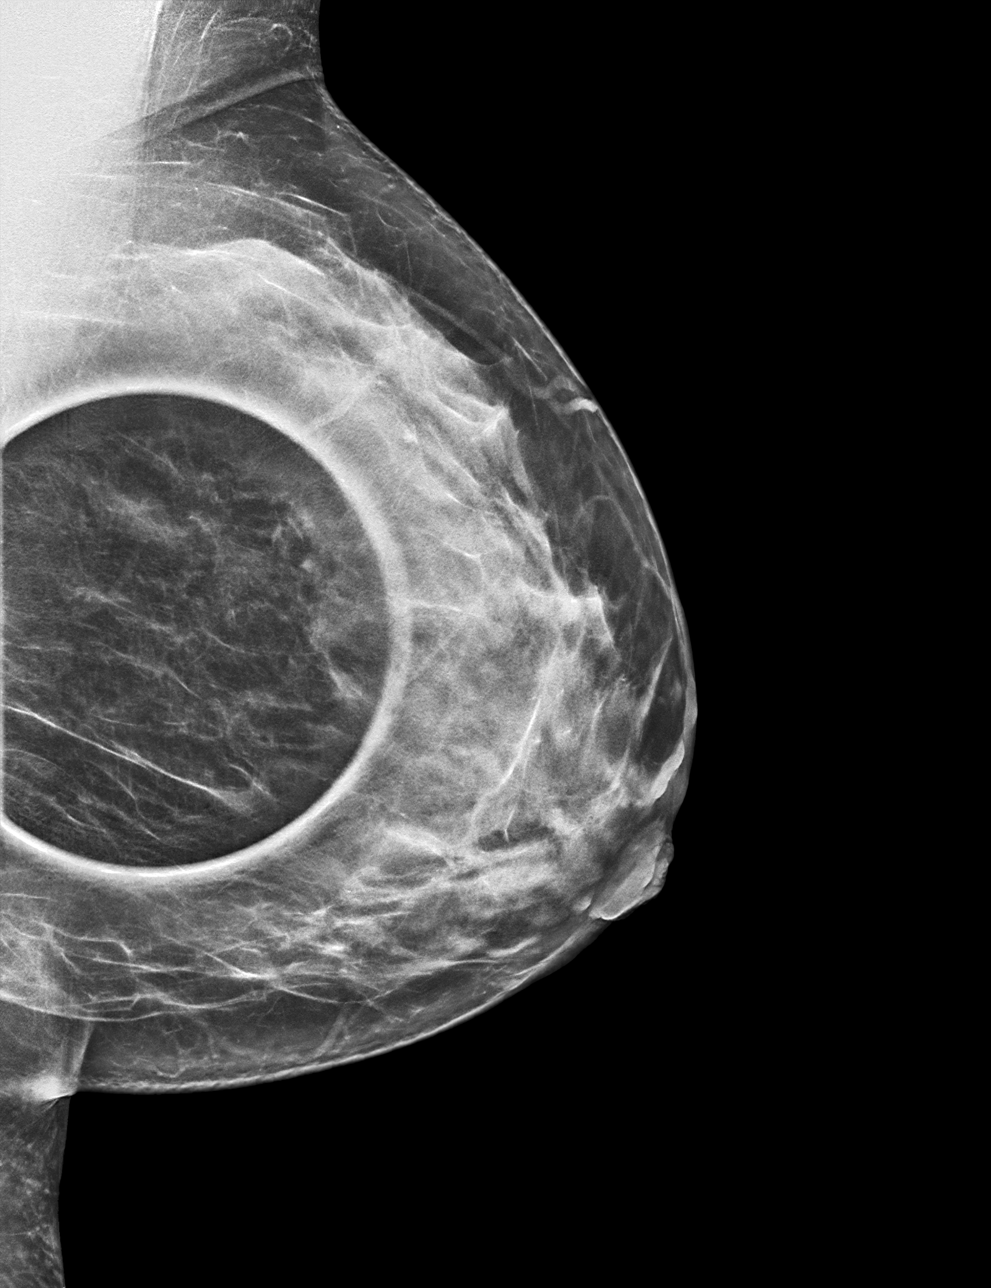

[L ML tomo · tomo slice 27/54.0]
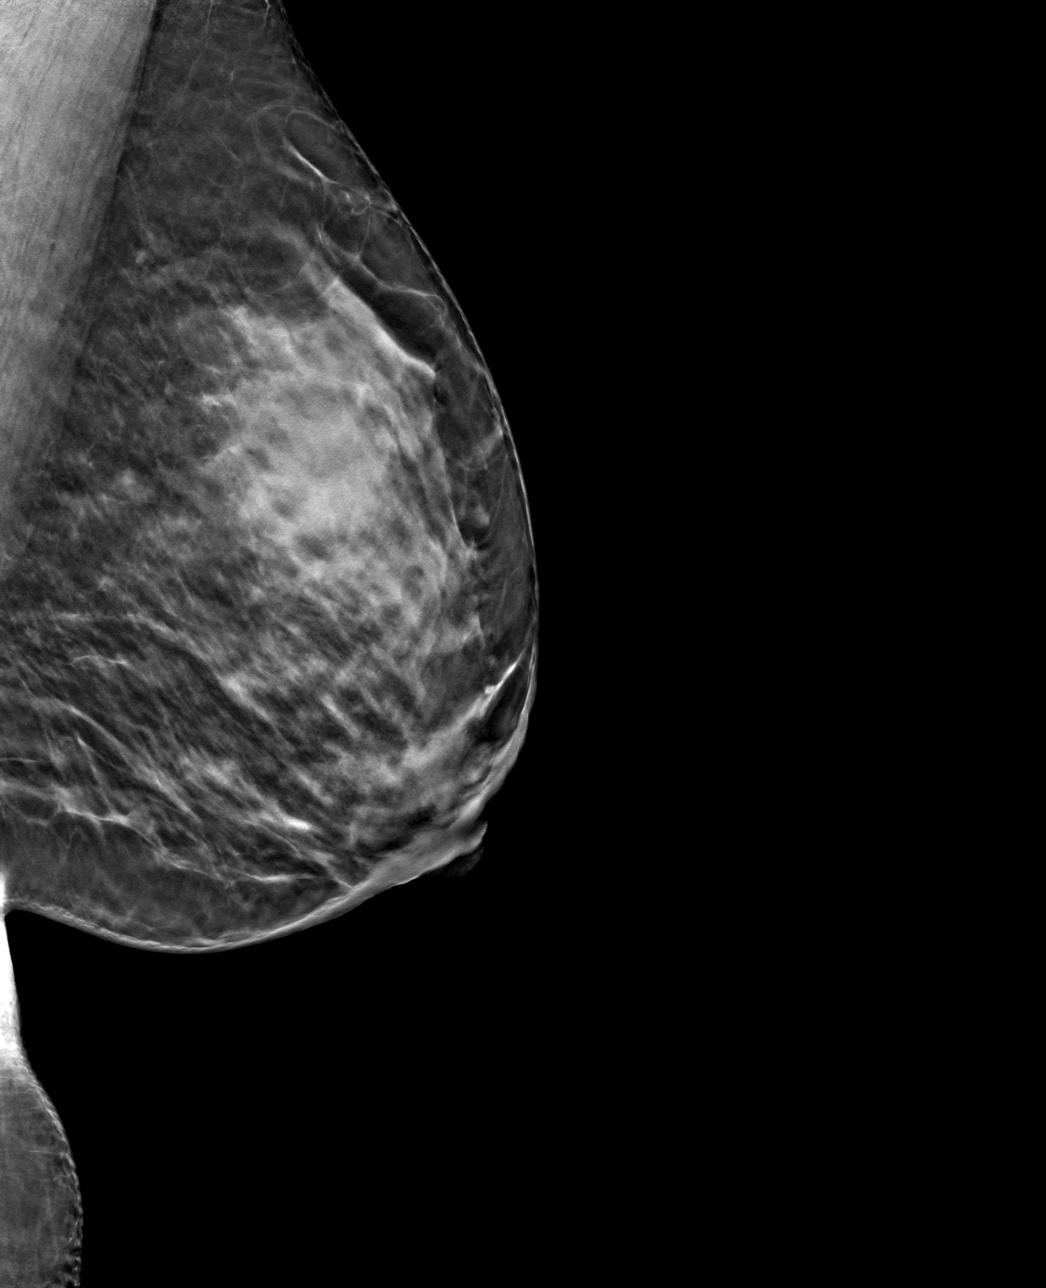

[L MLO tomo · tomo slice 30/59.0]
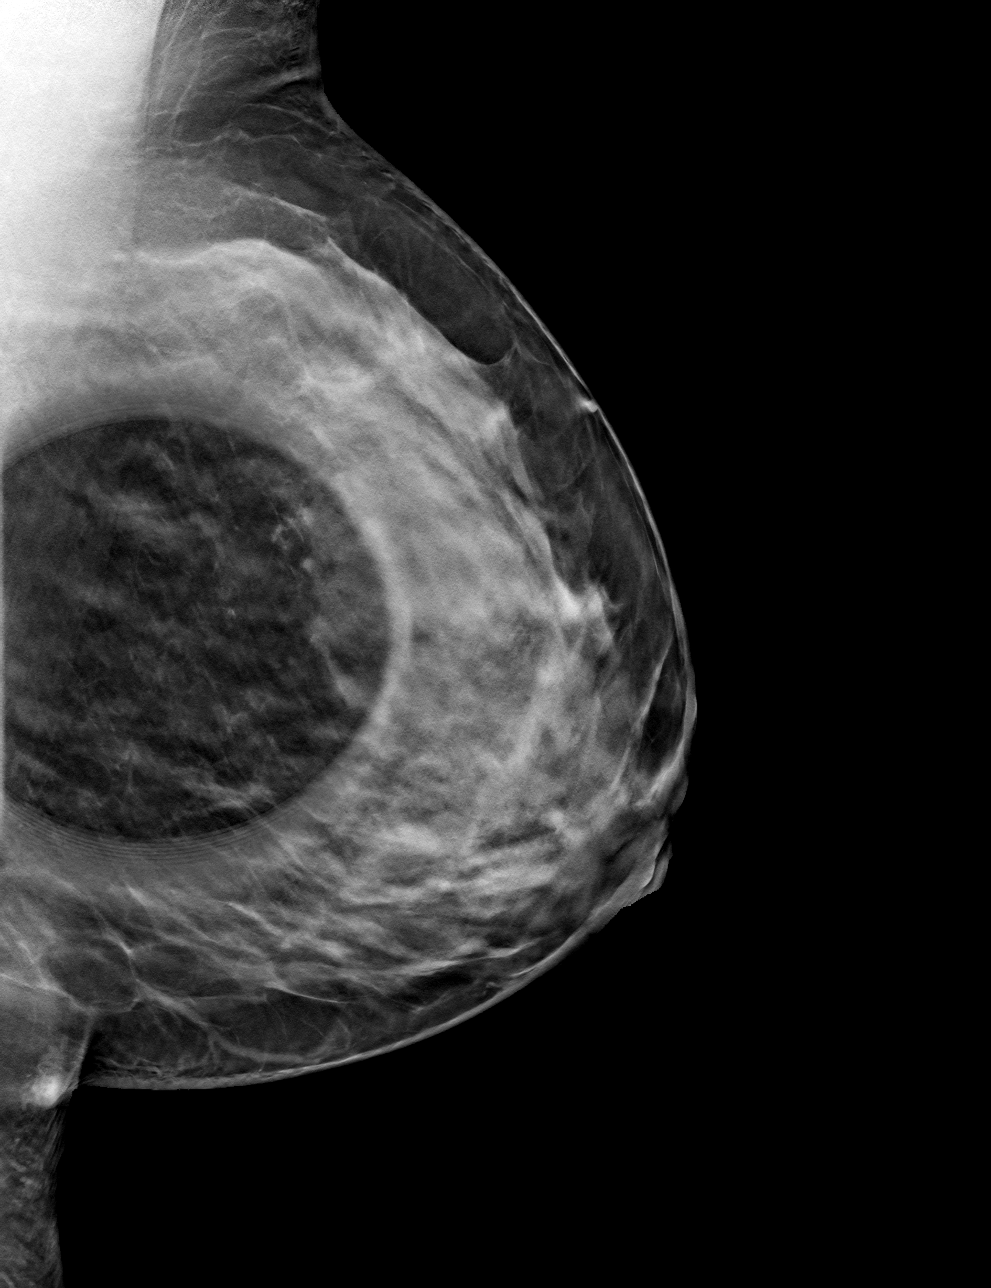

[4 of 12 positions shown; findings below may reference images not displayed]

ACR Breast Density Category c: The breast tissue is heterogeneously
dense, which may obscure small masses.
FINDINGS: Spot-compression MLO view of the area of concern and a full field
mediolateral view were obtained.

The asymmetry in the upper breast at posterior depth persists but
partially disperses with compression, likely indicating an island of
fibroglandular tissue. On the full field mediolateral view, the
asymmetry is in a similar location when compared to its location on
the MLO view, indicating that it is at or near 12 o'clock. On the
full field mediolateral view, the asymmetry has the appearance of an
island of fibroglandular tissue.

Targeted ultrasound is performed, demonstrating normal
fibroglandular tissue throughout the upper breast with imaging from
10:30 o'clock through 12 o'clock to 1:30 o'clock. No cyst, solid
mass or abnormal acoustic shadowing is identified. There is an
island of fibroglandular tissue at the 10:30 o'clock position 4 cm
from nipple at posterior depth which has a similar morphology to the
mammographic finding.
IMPRESSION: No mammographic or sonographic evidence of malignancy involving the
LEFT breast.

RECOMMENDATION:
Screening mammogram in one year.(Code:X7-3-TNT)

I have discussed the findings and recommendations with the patient.
If applicable, a reminder letter will be sent to the patient
regarding the next appointment.

BI-RADS CATEGORY  1: Negative.

## 2023-09-18 ENCOUNTER — Other Ambulatory Visit (HOSPITAL_COMMUNITY)
Admission: RE | Admit: 2023-09-18 | Discharge: 2023-09-18 | Disposition: A | Discharge status: Home / Self Care | Source: Ambulatory Visit | Attending: Family Medicine | Admitting: Family Medicine

## 2023-09-18 ENCOUNTER — Ambulatory Visit: Payer: Managed Care, Other (non HMO) | Admitting: Family Medicine

## 2023-09-18 ENCOUNTER — Ambulatory Visit
Admission: RE | Admit: 2023-09-18 | Discharge: 2023-09-18 | Disposition: A | Discharge status: Home / Self Care | Source: Ambulatory Visit | Attending: Family Medicine | Admitting: Family Medicine

## 2023-09-18 ENCOUNTER — Encounter: Payer: Self-pay | Admitting: Family Medicine

## 2023-09-18 VITALS — BP 152/105 | HR 80 | Ht 63.0 in | Wt 183.0 lb

## 2023-09-18 DIAGNOSIS — Z1501 Genetic susceptibility to malignant neoplasm of breast: Secondary | ICD-10-CM | POA: Insufficient documentation

## 2023-09-18 DIAGNOSIS — Z1509 Genetic susceptibility to other malignant neoplasm: Secondary | ICD-10-CM | POA: Insufficient documentation

## 2023-09-18 DIAGNOSIS — Z8481 Family history of carrier of genetic disease: Secondary | ICD-10-CM | POA: Diagnosis not present

## 2023-09-18 DIAGNOSIS — N946 Dysmenorrhea, unspecified: Secondary | ICD-10-CM

## 2023-09-18 DIAGNOSIS — Z01419 Encounter for gynecological examination (general) (routine) without abnormal findings: Secondary | ICD-10-CM | POA: Diagnosis present

## 2023-09-18 DIAGNOSIS — Z1231 Encounter for screening mammogram for malignant neoplasm of breast: Secondary | ICD-10-CM | POA: Diagnosis present

## 2023-09-18 DIAGNOSIS — N920 Excessive and frequent menstruation with regular cycle: Secondary | ICD-10-CM | POA: Diagnosis not present

## 2023-09-18 MED ORDER — NORETHINDRONE 0.35 MG PO TABS: 1.0000 | ORAL_TABLET | Freq: Every day | ORAL | 4 refills | Status: DC

## 2023-09-18 NOTE — Progress Notes (Signed)
 GYNECOLOGY ANNUAL PREVENTATIVE CARE ENCOUNTER NOTE  Subjective:   Patricia Yu is a 43 y.o. G13P3003 female here for a routine annual gynecologic exam.  Current complaints: dysmenorrhea/cramping.     Menses are regular periods every 28 days, heavy for 4-5 days, last 10d  Denies abnormal vaginal bleeding, discharge, pelvic pain, problems with intercourse or other gynecologic concerns.    Gynecologic History Patient's last menstrual period was 08/13/2023. Contraception: tubal ligation Last Pap: 2023. Results were: normal Last mammogram: WNL. Results were: normal  Health Maintenance Due  Topic Date Due   HIV Screening  Never done   Hepatitis C Screening  Never done   Cervical Cancer Screening (HPV/Pap Cotest)  Never done   COVID-19 Vaccine (3 - 2024-25 season) 02/22/2023    The following portions of the patient's history were reviewed and updated as appropriate: allergies, current medications, past family history, past medical history, past social history, past surgical history and problem list.  Review of Systems Pertinent items are noted in HPI.   Objective:  BP (!) 152/105   Pulse 80   Ht 5\' 3"  (1.6 m)   Wt 183 lb (83 kg)   LMP 08/13/2023   BMI 32.42 kg/m  CONSTITUTIONAL: Well-developed, well-nourished female in no acute distress.  HENT:  Normocephalic, atraumatic, External right and left ear normal. Oropharynx is clear and moist EYES:  No scleral icterus.  NECK: Normal range of motion, supple, no masses.  Normal thyroid.  SKIN: Skin is warm and dry. No rash noted. Not diaphoretic. No erythema. No pallor. NEUROLOGIC: Alert and oriented to person, place, and time. Normal reflexes, muscle tone coordination. No cranial nerve deficit noted. PSYCHIATRIC: Normal mood and affect. Normal behavior. Normal judgment and thought content. CARDIOVASCULAR: Normal heart rate noted, regular rhythm. 2+ distal pulses. RESPIRATORY: Effort and breath sounds normal, no problems with  respiration noted. BREASTS: Symmetric in size. No masses, skin changes, nipple drainage, or lymphadenopathy. ABDOMEN: Soft,  no distention noted.  No tenderness, rebound or guarding.  PELVIC: Normal appearing external genitalia; normal appearing vaginal mucosa and cervix.  No abnormal discharge noted. Pap smear obtained.  Normal uterine size, no other palpable masses, no uterine or adnexal tenderness. Chaperone present for exam MUSCULOSKELETAL: Normal range of motion.      Assessment and Plan:  1) Annual gynecologic examination with pap smear:  Will follow up results of pap smear and manage accordingly.   Routine preventative health maintenance measures emphasized. Reviewed perimenopausal symptoms and management.   2) Contraception counseling: BTL, she is interested in hormone control for menses and heavy painful cycles  1. Well woman exam with routine gynecological exam (Primary) - Cytology - PAP - MM 3D SCREENING MAMMOGRAM BILATERAL BREAST; Future  2. BRCA1 gene mutation positive - Discussed breast MRI every year and mammogram every year - Cytology - PAP - MM 3D SCREENING MAMMOGRAM BILATERAL BREAST; Future - MR BREAST BILATERAL WO CONTRAST; Future  3. Family history of BRCA1 gene positive - Cytology - PAP - MM 3D SCREENING MAMMOGRAM BILATERAL BREAST; Future - MR BREAST BILATERAL WO CONTRAST; Future  4. Menorrhagia with regular cycle [phone] - Reviewed desire to avoid estrogen - Discussed pop, depo, iud  - norethindrone (MICRONOR) 0.35 MG tablet; Take 1 tablet (0.35 mg total) by mouth daily.  Dispense: 84 tablet; Refill: 4 - US PELVIC COMPLETE WITH TRANSVAGINAL; Future  5. Dysmenorrhea [id number] - norethindrone (MICRONOR) 0.35 MG tablet; Take 1 tablet (0.35 mg total) by mouth daily.  Dispense: 84 tablet; Refill: 4 -  US PELVIC COMPLETE WITH TRANSVAGINAL; Future   Please refer to After Visit Summary for other counseling recommendations.   No follow-ups on file.  Federico Flake, MD, MPH, ABFM Attending Physician Center for [facility]

## 2023-09-18 NOTE — Progress Notes (Signed)
 Patient presents for Annual.  LMP: 08/13/23 Last pap:  59yrs ago Contraception: None BTL Mammogram:  10/04/23 diagnostic done 10/15/21 pt has already had genetic screening has BRCA gene Mom had Breast cancer. Wants mammmogram at [place]. STD Screening: Declines   CC:  Painful periods pain will be 10/10x takes 600 mg IBP no relief.

## 2023-09-23 LAB — CYTOLOGY - PAP
Adequacy: ABSENT
Comment: NEGATIVE
Diagnosis: NEGATIVE
High risk HPV: NEGATIVE

## 2023-09-24 ENCOUNTER — Ambulatory Visit: Attending: Family Medicine

## 2023-09-25 ENCOUNTER — Other Ambulatory Visit: Payer: Self-pay

## 2023-09-25 ENCOUNTER — Ambulatory Visit: Admission: RE | Admit: 2023-09-25 | Source: Ambulatory Visit

## 2023-09-25 DIAGNOSIS — Z8481 Family history of carrier of genetic disease: Secondary | ICD-10-CM

## 2023-09-25 DIAGNOSIS — Z1501 Genetic susceptibility to malignant neoplasm of breast: Secondary | ICD-10-CM

## 2023-09-28 ENCOUNTER — Encounter: Payer: Self-pay | Admitting: Family Medicine

## 2023-09-28 ENCOUNTER — Ambulatory Visit: Admission: RE | Admit: 2023-09-28 | Source: Ambulatory Visit

## 2023-10-09 ENCOUNTER — Ambulatory Visit
Admission: RE | Admit: 2023-10-09 | Discharge: 2023-10-09 | Disposition: A | Discharge status: Home / Self Care | Source: Ambulatory Visit | Attending: Family Medicine | Admitting: Family Medicine

## 2023-10-09 DIAGNOSIS — Z8481 Family history of carrier of genetic disease: Secondary | ICD-10-CM | POA: Insufficient documentation

## 2023-10-09 DIAGNOSIS — Z1501 Genetic susceptibility to malignant neoplasm of breast: Secondary | ICD-10-CM | POA: Diagnosis present

## 2023-10-09 DIAGNOSIS — Z1509 Genetic susceptibility to other malignant neoplasm: Secondary | ICD-10-CM | POA: Diagnosis present

## 2023-10-09 MED ORDER — GADOBUTROL 1 MMOL/ML IV SOLN
7.5000 mL | Freq: Once | INTRAVENOUS | Status: AC | PRN
  Administered 2023-10-09: 7.5 mL via INTRAVENOUS

## 2023-12-04 DIAGNOSIS — L819 Disorder of pigmentation, unspecified: Secondary | ICD-10-CM | POA: Insufficient documentation

## 2023-12-04 DIAGNOSIS — F40243 Fear of flying: Secondary | ICD-10-CM | POA: Insufficient documentation

## 2023-12-04 DIAGNOSIS — Z8349 Family history of other endocrine, nutritional and metabolic diseases: Secondary | ICD-10-CM | POA: Insufficient documentation

## 2023-12-04 DIAGNOSIS — F4320 Adjustment disorder, unspecified: Secondary | ICD-10-CM | POA: Insufficient documentation

## 2024-01-16 ENCOUNTER — Emergency Department
Admission: EM | Admit: 2024-01-16 | Discharge: 2024-01-16 | Disposition: A | Discharge status: Home / Self Care | Attending: Emergency Medicine | Admitting: Emergency Medicine

## 2024-01-16 ENCOUNTER — Emergency Department

## 2024-01-16 ENCOUNTER — Other Ambulatory Visit: Payer: Self-pay

## 2024-01-16 DIAGNOSIS — N9489 Other specified conditions associated with female genital organs and menstrual cycle: Secondary | ICD-10-CM | POA: Diagnosis not present

## 2024-01-16 DIAGNOSIS — R1030 Lower abdominal pain, unspecified: Secondary | ICD-10-CM | POA: Diagnosis present

## 2024-01-16 DIAGNOSIS — D219 Benign neoplasm of connective and other soft tissue, unspecified: Secondary | ICD-10-CM | POA: Diagnosis not present

## 2024-01-16 DIAGNOSIS — M545 Low back pain, unspecified: Secondary | ICD-10-CM | POA: Insufficient documentation

## 2024-01-16 LAB — URINALYSIS, ROUTINE W REFLEX MICROSCOPIC
Bilirubin Urine: NEGATIVE
Glucose, UA: NEGATIVE mg/dL
Ketones, ur: NEGATIVE mg/dL
Leukocytes,Ua: NEGATIVE
Nitrite: NEGATIVE
Protein, ur: NEGATIVE mg/dL
Specific Gravity, Urine: 1.023 (ref 1.005–1.030)
pH: 5 (ref 5.0–8.0)

## 2024-01-16 LAB — COMPREHENSIVE METABOLIC PANEL WITH GFR
ALT: 17 U/L (ref 0–44)
AST: 19 U/L (ref 15–41)
Albumin: 3.7 g/dL (ref 3.5–5.0)
Alkaline Phosphatase: 52 U/L (ref 38–126)
Anion gap: 9 (ref 5–15)
BUN: 19 mg/dL (ref 6–20)
CO2: 22 mmol/L (ref 22–32)
Calcium: 9.3 mg/dL (ref 8.9–10.3)
Chloride: 108 mmol/L (ref 98–111)
Creatinine, Ser: 1.27 mg/dL — ABNORMAL HIGH (ref 0.44–1.00)
GFR, Estimated: 53 mL/min — ABNORMAL LOW (ref >60)
Glucose, Bld: 106 mg/dL — ABNORMAL HIGH (ref 70–99)
Potassium: 3.9 mmol/L (ref 3.5–5.1)
Sodium: 139 mmol/L (ref 135–145)
Total Bilirubin: 0.6 mg/dL (ref 0.0–1.2)
Total Protein: 7.1 g/dL (ref 6.5–8.1)

## 2024-01-16 LAB — CBC
HCT: 36.3 % (ref 36.0–46.0)
Hemoglobin: 11.7 g/dL — ABNORMAL LOW (ref 12.0–15.0)
MCH: 26.2 pg (ref 26.0–34.0)
MCHC: 32.2 g/dL (ref 30.0–36.0)
MCV: 81.4 fL (ref 80.0–100.0)
Platelets: 302 K/uL (ref 150–400)
RBC: 4.46 MIL/uL (ref 3.87–5.11)
RDW: 13.5 % (ref 11.5–15.5)
WBC: 4.3 K/uL (ref 4.0–10.5)
nRBC: 0 % (ref 0.0–0.2)

## 2024-01-16 LAB — LIPASE, BLOOD: Lipase: 62 U/L — ABNORMAL HIGH (ref 11–51)

## 2024-01-16 LAB — POC URINE PREG, ED: Preg Test, Ur: NEGATIVE

## 2024-01-16 MED ORDER — KETOROLAC TROMETHAMINE 15 MG/ML IJ SOLN
15.0000 mg | Freq: Once | INTRAMUSCULAR | Status: AC
  Administered 2024-01-16: 15 mg via INTRAMUSCULAR
  Filled 2024-01-16: qty 1

## 2024-01-16 MED ORDER — NAPROXEN 500 MG PO TABS
500.0000 mg | ORAL_TABLET | Freq: Two times a day (BID) | ORAL | 0 refills | Status: AC
Start: 2024-01-16 — End: 2024-01-23

## 2024-01-16 NOTE — ED Triage Notes (Signed)
 Pt to ED for bilateral lower abdominal pain and lower back pain since this AM. Took motrin  PTA. Denies urinary symptoms. Currently on period. Hx menorrhagia and ovarian cyst (unsure which side).

## 2024-01-16 NOTE — Discharge Instructions (Signed)
 Your ultrasound reveals multiple fibroids and cysts as we discussed.  It was highly recommended that you undergo MRI today to further characterize these structures.  However you did not wish to do this today.  Please have this ordered by your outpatient provider as soon as possible.  If you are unable to get this ordered from your outpatient provider, you may return to the emergency department.  Please return for any new, worsening, or change in symptoms or other concerns.  It was a pleasure caring for you today

## 2024-01-16 NOTE — ED Provider Notes (Signed)
 [facility] Provider Note    Event Date/Time   First MD Initiated Contact with Patient 01/16/24 1008     (approximate)   History   Abdominal Pain   HPI  Patricia Yu is a 43 y.o. female who presents today for evaluation of lower abdominal cramping for the past several days.  Patient reports that it feels like her normal menstrual cramps, however slightly worse than usual.  She reports that her periods are regular, however this period came slightly earlier than it should have.  She reports that she has been going through for pads per day which is typical for her.  No nausea or vomiting.  She reports that she has pain in her low back as well.  She reports that she was recently started on OCPs by her PCP given her painful periods.  Patient Active Problem List   Diagnosis Date Noted   BRCA1 gene mutation positive 11/25/2019   Genetic testing 11/25/2019   Family history of BRCA1 gene positive    Family history of breast cancer           Physical Exam   Triage Vital Signs: ED Triage Vitals  Encounter Vitals Group     BP 01/16/24 1000 (!) 165/108     Girls Systolic BP Percentile --      Girls Diastolic BP Percentile --      Boys Systolic BP Percentile --      Boys Diastolic BP Percentile --      Pulse Rate 01/16/24 1000 96     Resp 01/16/24 1000 20     Temp 01/16/24 1000 98.8 F (37.1 C)     Temp Source 01/16/24 1000 Oral     SpO2 01/16/24 1000 98 %     Weight 01/16/24 0957 183 lb (83 kg)     Height 01/16/24 0957 5' 3 (1.6 m)     Head Circumference --      Peak Flow --      Pain Score 01/16/24 0956 9     Pain Loc --      Pain Education --      Exclude from Growth Chart --     Most recent vital signs: Vitals:   01/16/24 1000 01/16/24 1028  BP: (!) 165/108   Pulse: 96   Resp: 20   Temp: 98.8 F (37.1 C)   SpO2: 98% 98%    Physical Exam Vitals and nursing note reviewed.  Constitutional:      General: Awake and alert. No acute  distress.    Appearance: Normal appearance. The patient is normal weight.  HENT:     Head: Normocephalic and atraumatic.     Mouth: Mucous membranes are moist.  Eyes:     General: PERRL. Normal EOMs        Right eye: No discharge.        Left eye: No discharge.     Conjunctiva/sclera: Conjunctivae normal.  Cardiovascular:     Rate and Rhythm: Normal rate and regular rhythm.     Pulses: Normal pulses.  Pulmonary:     Effort: Pulmonary effort is normal. No respiratory distress.     Breath sounds: Normal breath sounds.  Abdominal:     Abdomen is soft. There is very mild suprapubic abdominal tenderness. No rebound or guarding. No distention. Musculoskeletal:        General: No swelling. Normal range of motion.     Cervical back: Normal range of motion  and neck supple.  Skin:    General: Skin is warm and dry.     Capillary Refill: Capillary refill takes less than 2 seconds.     Findings: No rash.  Neurological:     Mental Status: The patient is awake and alert.      ED Results / Procedures / Treatments   Labs (all labs ordered are listed, but only abnormal results are displayed) Labs Reviewed  LIPASE, BLOOD - Abnormal; Notable for the following components:      Result Value   Lipase 62 (*)    All other components within normal limits  COMPREHENSIVE METABOLIC PANEL WITH GFR - Abnormal; Notable for the following components:   Glucose, Bld 106 (*)    Creatinine, Ser 1.27 (*)    GFR, Estimated 53 (*)    All other components within normal limits  CBC - Abnormal; Notable for the following components:   Hemoglobin 11.7 (*)    All other components within normal limits  URINALYSIS, ROUTINE W REFLEX MICROSCOPIC - Abnormal; Notable for the following components:   Color, Urine YELLOW (*)    APPearance HAZY (*)    Hgb urine dipstick MODERATE (*)    Bacteria, UA RARE (*)    All other components within normal limits  POC URINE PREG, ED     EKG     RADIOLOGY I independently  reviewed and interpreted imaging and agree with radiologists findings.     PROCEDURES:  Critical Care performed:   Procedures   MEDICATIONS ORDERED IN ED: Medications  ketorolac  (TORADOL ) 15 MG/ML injection 15 mg (15 mg Intramuscular Given 01/16/24 1030)     IMPRESSION / MDM / ASSESSMENT AND PLAN / ED COURSE  I reviewed the triage vital signs and the nursing notes.   Differential diagnosis includes, but is not limited to, fibroids, abnormal uterine bleeding, ovarian cyst, endometrioma, urinary tract infection.  Patient is awake and alert, hemodynamically stable and afebrile.  She is nontoxic in appearance.  She has very minimal suprapubic discomfort, no rebound or guarding.  Labs obtained in triage are overall reassuring.  Urinalysis reveals moderate hemoglobin, though patient reports that she is currently on her menstrual cycle.  She was given Toradol  with near complete resolution of her pain.  Ultrasound obtained reveals fibroid uterus and cystic structures in the pelvis.  I recommended that patient undergo MRI with and without contrast for further evaluation as recommended by the radiologist.  However, patient does not wish to do this test today.  We discussed the possibility of neoplasm as the radiologist listed.  Patient still wishes to have this test done as an outpatient.  She has an OB/GYN and a PCP whom she reports can order the MRI.  We discussed the importance of prompt diagnosis and treatment.  Advised that she can return to the emergency department if unable to get it outpatient.  Patient understands and agrees with plan.  Discharged in stable condition    Patient's presentation is most consistent with acute presentation with potential threat to life or bodily function.   Clinical Course as of 01/16/24 1603  Sat Jan 16, 2024  1242 Discussed ultrasound results with the patient, recommended that she undergo MRI today.  However patient does not wish to undergo MRI.  She  has an outpatient OB/GYN with whom she can get the MRI.  She was advised that if she cannot get this outpatient within a timely manner, then she should return to the emergency department for this  imaging [JP]    Clinical Course User Index [JP] Shaleta Ruacho E, PA-C     FINAL CLINICAL IMPRESSION(S) / ED DIAGNOSES   Final diagnoses:  Fibroids  Pelvic cyst in female     Rx / DC Orders   ED Discharge Orders          Ordered    naproxen  (NAPROSYN ) 500 MG tablet  2 times daily with meals        01/16/24 1302             Note:  This document was prepared using Dragon voice recognition software and may include unintentional dictation errors.   Tirza Senteno E, PA-C 01/16/24 1603    Arlander Charleston, MD 01/17/24 [phone]

## 2024-01-19 DIAGNOSIS — R519 Headache, unspecified: Secondary | ICD-10-CM | POA: Insufficient documentation

## 2024-01-19 DIAGNOSIS — M7989 Other specified soft tissue disorders: Secondary | ICD-10-CM | POA: Insufficient documentation

## 2024-01-19 DIAGNOSIS — R102 Pelvic and perineal pain: Secondary | ICD-10-CM | POA: Insufficient documentation

## 2024-01-19 DIAGNOSIS — R748 Abnormal levels of other serum enzymes: Secondary | ICD-10-CM | POA: Insufficient documentation

## 2024-01-19 DIAGNOSIS — N926 Irregular menstruation, unspecified: Secondary | ICD-10-CM | POA: Insufficient documentation

## 2024-01-26 ENCOUNTER — Other Ambulatory Visit: Payer: Self-pay | Admitting: Family Medicine

## 2024-01-26 DIAGNOSIS — M7989 Other specified soft tissue disorders: Secondary | ICD-10-CM

## 2024-01-28 ENCOUNTER — Ambulatory Visit (INDEPENDENT_AMBULATORY_CARE_PROVIDER_SITE_OTHER): Admitting: Family Medicine

## 2024-01-28 ENCOUNTER — Encounter: Payer: Self-pay | Admitting: Family Medicine

## 2024-01-28 VITALS — BP 149/106 | HR 94 | Wt 185.0 lb

## 2024-01-28 DIAGNOSIS — Z1509 Genetic susceptibility to other malignant neoplasm: Secondary | ICD-10-CM

## 2024-01-28 DIAGNOSIS — D259 Leiomyoma of uterus, unspecified: Secondary | ICD-10-CM | POA: Diagnosis not present

## 2024-01-28 DIAGNOSIS — N920 Excessive and frequent menstruation with regular cycle: Secondary | ICD-10-CM | POA: Insufficient documentation

## 2024-01-28 DIAGNOSIS — N83201 Unspecified ovarian cyst, right side: Secondary | ICD-10-CM | POA: Diagnosis not present

## 2024-01-28 DIAGNOSIS — Z1501 Genetic susceptibility to malignant neoplasm of breast: Secondary | ICD-10-CM

## 2024-01-28 NOTE — Assessment & Plan Note (Signed)
 Given genetic mutation should be scheduled for prophylactic BSO between age 44-40. This is not done, but pelvic sonogram shows solid mass and cystic mass. Check ROMA and pelvic MRI. Refer to GYN/ONC if either of these are abnormal.

## 2024-01-28 NOTE — Progress Notes (Signed)
 RGYN here for F/U on recent U/S.   LMP: 01/11/24  Had bad period that was heavy , pain associated w/ clots.  Pt notes she still has not received a call for her MRI to get scheduled.

## 2024-01-28 NOTE — Assessment & Plan Note (Signed)
 Her mother was diagnosed initially in her 30s. Has maternal aunt and grandmother with the same.Will refer to breast surgeon for discussion of possible risk reducing mammogram, chemoprevention or timing of screening tests.

## 2024-01-28 NOTE — Progress Notes (Signed)
   Subjective:    Patient ID: Patricia Yu is a 43 y.o. female presenting with Follow-up  on 01/28/2024  HPI: Has heavy painful cycles.Heavy with clots. U/s shows fibroids. She is s/p BTL. Patient with BRCA1 mutation and has completed childbearing. Had pelvic sonogram with some concerning findings in adnexa. Ed ordered a pelvic MRI, but this was not done.  Normal mammogram and breast MRI this year. Has not seen breast MD after genetics positive for BRCA1.  Review of Systems  Constitutional:  Negative for chills and fever.  Respiratory:  Negative for shortness of breath.   Cardiovascular:  Negative for chest pain.  Gastrointestinal:  Negative for abdominal pain, nausea and vomiting.  Genitourinary:  Negative for dysuria.  Skin:  Negative for rash.      Objective:    BP (!) 149/106   Pulse 94   Wt 185 lb (83.9 kg)   LMP 01/11/2024 (Exact Date)   BMI 32.77 kg/m  Physical Exam Exam conducted with a chaperone present.  Constitutional:      General: She is not in acute distress.    Appearance: She is well-developed.  HENT:     Head: Normocephalic and atraumatic.  Eyes:     General: No scleral icterus. Cardiovascular:     Rate and Rhythm: Normal rate.  Pulmonary:     Effort: Pulmonary effort is normal.  Abdominal:     Palpations: Abdomen is soft.  Musculoskeletal:     Cervical back: Neck supple.  Skin:    General: Skin is warm and dry.  Neurological:     Mental Status: She is alert and oriented to person, place, and time.          Assessment & Plan:   Problem List Items Addressed This Visit       Unprioritized   BRCA1 gene mutation positive - Primary   Her mother was diagnosed initially in her 30s. Has maternal aunt and grandmother with the same.Will refer to breast surgeon for discussion of possible risk reducing mammogram, chemoprevention or timing of screening tests.      Relevant Orders   Ambulatory referral to General Surgery   Right ovarian cyst    Given genetic mutation should be scheduled for prophylactic BSO between age 87-40. This is not done, but pelvic sonogram shows solid mass and cystic mass. Check ROMA and pelvic MRI. Refer to GYN/ONC if either of these are abnormal.      Relevant Orders   Ovarian Malignancy Risk-ROMA   Fibroid uterus   Likely cause of heavy vaginal bleeding. Hgb 11.7 recently.       Menorrhagia   Should r/o endometrial process with EMB. Discussed and she would like to return for this. Given need for other surgery, would strongly consider hysterectomy at time of BSO.        Return in about 4 weeks (around 02/25/2024) for please schedule MRI of pelvis, a follow-up.  Glenys GORMAN Birk, MD 01/28/2024 10:45 AM

## 2024-01-28 NOTE — Assessment & Plan Note (Signed)
 Should r/o endometrial process with EMB. Discussed and she would like to return for this. Given need for other surgery, would strongly consider hysterectomy at time of BSO.

## 2024-01-28 NOTE — Assessment & Plan Note (Signed)
 Likely cause of heavy vaginal bleeding. Hgb 11.7 recently.

## 2024-01-29 LAB — PREMENOPAUSAL INTERP: HIGH

## 2024-01-29 LAB — POSTMENOPAUSAL INTERP: HIGH

## 2024-01-29 LAB — OVARIAN MALIGNANCY RISK-ROMA
Cancer Antigen (CA) 125: 41.1 U/mL — ABNORMAL HIGH (ref 0.0–38.1)
HE4: 121 pmol/L — ABNORMAL HIGH (ref 0.0–63.6)
Postmenopausal ROMA: 4.06 — ABNORMAL HIGH
Premenopausal ROMA: 4.13 — ABNORMAL HIGH

## 2024-02-01 ENCOUNTER — Encounter: Payer: Self-pay | Admitting: Family Medicine

## 2024-02-01 ENCOUNTER — Ambulatory Visit: Payer: Self-pay | Admitting: Family Medicine

## 2024-02-01 DIAGNOSIS — N83201 Unspecified ovarian cyst, right side: Secondary | ICD-10-CM

## 2024-02-01 DIAGNOSIS — Z1501 Genetic susceptibility to malignant neoplasm of breast: Secondary | ICD-10-CM

## 2024-02-02 ENCOUNTER — Telehealth: Payer: Self-pay

## 2024-02-02 ENCOUNTER — Other Ambulatory Visit: Payer: Self-pay | Admitting: *Deleted

## 2024-02-02 DIAGNOSIS — N83201 Unspecified ovarian cyst, right side: Secondary | ICD-10-CM

## 2024-02-02 DIAGNOSIS — Z8481 Family history of carrier of genetic disease: Secondary | ICD-10-CM

## 2024-02-02 DIAGNOSIS — Z1501 Genetic susceptibility to malignant neoplasm of breast: Secondary | ICD-10-CM

## 2024-02-02 DIAGNOSIS — Z1509 Genetic susceptibility to other malignant neoplasm: Secondary | ICD-10-CM

## 2024-02-02 NOTE — Telephone Encounter (Signed)
 Spoke with the patient regarding the referral to GYN oncology. Patient scheduled as new patient with Dr Rogelio on 02/17/2024. Patient given an arrival time of 8:30am.  Explained to the patient the the doctor will perform a pelvic exam at this visit. Patient given the policy that only one visitor allowed and that visitor must be over 16 yrs are allowed in the Cancer Center. Patient given the address/phone number for the clinic and that the center offers free valet service. Patient aware that masks required.

## 2024-02-02 NOTE — Progress Notes (Signed)
 Erroneous

## 2024-02-05 ENCOUNTER — Other Ambulatory Visit: Payer: Self-pay

## 2024-02-05 ENCOUNTER — Emergency Department
Admission: EM | Admit: 2024-02-05 | Discharge: 2024-02-05 | Disposition: A | Discharge status: Home / Self Care | Attending: Emergency Medicine | Admitting: Emergency Medicine

## 2024-02-05 ENCOUNTER — Ambulatory Visit

## 2024-02-05 ENCOUNTER — Emergency Department

## 2024-02-05 DIAGNOSIS — R1900 Intra-abdominal and pelvic swelling, mass and lump, unspecified site: Secondary | ICD-10-CM

## 2024-02-05 DIAGNOSIS — R1030 Lower abdominal pain, unspecified: Secondary | ICD-10-CM | POA: Diagnosis present

## 2024-02-05 DIAGNOSIS — N838 Other noninflammatory disorders of ovary, fallopian tube and broad ligament: Secondary | ICD-10-CM

## 2024-02-05 DIAGNOSIS — R1909 Other intra-abdominal and pelvic swelling, mass and lump: Secondary | ICD-10-CM | POA: Insufficient documentation

## 2024-02-05 LAB — CBC
HCT: 37.1 % (ref 36.0–46.0)
Hemoglobin: 11.9 g/dL — ABNORMAL LOW (ref 12.0–15.0)
MCH: 25.8 pg — ABNORMAL LOW (ref 26.0–34.0)
MCHC: 32.1 g/dL (ref 30.0–36.0)
MCV: 80.3 fL (ref 80.0–100.0)
Platelets: 298 K/uL (ref 150–400)
RBC: 4.62 MIL/uL (ref 3.87–5.11)
RDW: 13.2 % (ref 11.5–15.5)
WBC: 4.8 K/uL (ref 4.0–10.5)
nRBC: 0 % (ref 0.0–0.2)

## 2024-02-05 LAB — COMPREHENSIVE METABOLIC PANEL WITH GFR
ALT: 11 U/L (ref 0–44)
AST: 16 U/L (ref 15–41)
Albumin: 3.6 g/dL (ref 3.5–5.0)
Alkaline Phosphatase: 50 U/L (ref 38–126)
Anion gap: 7 (ref 5–15)
BUN: 16 mg/dL (ref 6–20)
CO2: 21 mmol/L — ABNORMAL LOW (ref 22–32)
Calcium: 9.3 mg/dL (ref 8.9–10.3)
Chloride: 109 mmol/L (ref 98–111)
Creatinine, Ser: 0.97 mg/dL (ref 0.44–1.00)
GFR, Estimated: >60 mL/min (ref >60)
Glucose, Bld: 104 mg/dL — ABNORMAL HIGH (ref 70–99)
Potassium: 3.5 mmol/L (ref 3.5–5.1)
Sodium: 137 mmol/L (ref 135–145)
Total Bilirubin: 0.9 mg/dL (ref 0.0–1.2)
Total Protein: 7.3 g/dL (ref 6.5–8.1)

## 2024-02-05 LAB — URINALYSIS, ROUTINE W REFLEX MICROSCOPIC
Bilirubin Urine: NEGATIVE
Glucose, UA: NEGATIVE mg/dL
Hgb urine dipstick: NEGATIVE
Ketones, ur: NEGATIVE mg/dL
Leukocytes,Ua: NEGATIVE
Nitrite: NEGATIVE
Protein, ur: NEGATIVE mg/dL
Specific Gravity, Urine: 1.019 (ref 1.005–1.030)
pH: 5 (ref 5.0–8.0)

## 2024-02-05 LAB — LIPASE, BLOOD: Lipase: 29 U/L (ref 11–51)

## 2024-02-05 MED ORDER — POLYETHYLENE GLYCOL 3350 17 G PO PACK: 17.0000 g | PACK | Freq: Every day | ORAL | 0 refills | Status: AC

## 2024-02-05 MED ORDER — GADOBUTROL 1 MMOL/ML IV SOLN
7.5000 mL | Freq: Once | INTRAVENOUS | Status: AC | PRN
  Administered 2024-02-05: 7.5 mL via INTRAVENOUS

## 2024-02-05 MED ORDER — OXYCODONE-ACETAMINOPHEN 5-325 MG PO TABS: 1.0000 | ORAL_TABLET | ORAL | 0 refills | Status: AC | PRN

## 2024-02-05 NOTE — ED Notes (Signed)
 Pt @MRI .

## 2024-02-05 NOTE — ED Provider Notes (Signed)
 [facility] Provider Note    Event Date/Time   First MD Initiated Contact with Patient 02/05/24 1159     (approximate)  History   Chief Complaint: Abdominal Pain  HPI  Patricia Yu is a 43 y.o. female with a past medical history of anemia, presents to the emergency department for lower abdominal pain.  According to the patient she has had 3 weeks of lower abdominal pain, was seen in the emergency department for the same about 3 weeks ago had an ultrasound performed at that time showing a cyst as well as a mass recommended MRI to delineate the mass.  Patient did not want the MRI at that time and instead decided to go home with OB/GYN follow-up.  Patient saw her OB/GYN and they recommended that she get the MRI they have ordered the MRI as an outpatient but the patient states she continued to have abdominal pain so she came to the emergency department today for evaluation.  Patient denies any vaginal bleeding or discharge.  No urinary symptoms.  No fever.  Physical Exam   Triage Vital Signs: ED Triage Vitals  Encounter Vitals Group     BP 02/05/24 0942 (!) 163/101     Girls Systolic BP Percentile --      Girls Diastolic BP Percentile --      Boys Systolic BP Percentile --      Boys Diastolic BP Percentile --      Pulse Rate 02/05/24 0942 97     Resp 02/05/24 0942 16     Temp 02/05/24 0942 99 F (37.2 C)     Temp Source 02/05/24 0942 Oral     SpO2 02/05/24 0942 98 %     Weight 02/05/24 0943 183 lb (83 kg)     Height 02/05/24 0943 5' 3 (1.6 m)     Head Circumference --      Peak Flow --      Pain Score 02/05/24 0943 7     Pain Loc --      Pain Education --      Exclude from Growth Chart --     Most recent vital signs: Vitals:   02/05/24 0942  BP: (!) 163/101  Pulse: 97  Resp: 16  Temp: 99 F (37.2 C)  SpO2: 98%    General: Awake, no distress.  CV:  Good peripheral perfusion.  Regular rate and rhythm  Resp:  Normal effort.  Equal breath  sounds bilaterally.  Abd:  No distention.  Soft, mild suprapubic tenderness to palpation.  No rebound or guarding.  ED Results / Procedures / Treatments   MEDICATIONS ORDERED IN ED: Medications - No data to display   IMPRESSION / MDM / ASSESSMENT AND PLAN / ED COURSE  I reviewed the triage vital signs and the nursing notes.  Patient's presentation is most consistent with acute presentation with potential threat to life or bodily function.  Patient presents to the emergency department for continued lower abdominal pain.  Symptoms have been ongoing for 3 weeks.  Patient had an ultrasound performed 3 weeks ago showing a mass within the pelvis as well as ovarian cyst that recommended an MRI to further evaluate.  Given the patient's continued pain we will obtain the MRI from the emergency department today.  Patient's workup shows a reassuring CBC with a normal white blood cell count, reassuring chemistry, normal lipase, normal LFTs.  Urinalysis shows no concerning finding.  We will proceed with an MRI.  Patient agreeable to plan.  FINAL CLINICAL IMPRESSION(S) / ED DIAGNOSES   Abdominal pain Abdominal mass  Note:  This document was prepared using Dragon voice recognition software and may include unintentional dictation errors.   Dorothyann Drivers, MD 02/05/24 1521

## 2024-02-05 NOTE — ED Provider Notes (Signed)
 Clinical Course as of 02/05/24 1725  Fri Feb 05, 2024  1515 Received signout. Came to ED 3 weeks ago for lower abdominal pain. Had US , didn't want to stay for MRI. Here for continued pain. Pending MRI pelvis.  [HD]  1535  Large complex cystic mass arising off the left ovary measuring 10.1 x 7.8 x 8.5 cm, with peripheral nodular enhancement. 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 Recommend referral to gyn oncology for further management. 2. Exophytic mass arising off the anterior wall of the rectum extending towards the cul-de-sac measuring 3.9 x 2.5 x 3.2 cm, with loss of the fat plane between this lesion and the posterior lower uterine segment. Differential considerations include primary rectal neoplasm versus lower uterine segment exophytic fibroid. Correlation with direct visualization is advised. 3. Multiple right ovary cysts measuring up to 4.8 cm. Follow-up ultrasound in 3 to 6 months advised. 4. Multiple uterine fibroids, including a 1.7 cm submucosal fibroid and a 2.5 cm subserosal fibroid. 5. Fat-containing supraumbilical hernia (3.3 cm) and umbilical hernia (1.8 cm).  Electronically signed by: Waddell Calk MD 02/05/2024 03:15 PM EDT RP Workstation: [id number]   [HD]  1541 Consulting gynecology regarding this patient [HD]  1541 1. Large complex cystic mass arising off the left ovary measuring 10.1 x 7.8 x 8.5 cm, with peripheral nodular enhancement. 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 Recommend referral to gyn oncology for further management. 2. Exophytic mass arising off the anterior wall of the rectum extending towards the cul-de-sac measuring 3.9 x 2.5 x 3.2 cm, with loss of the fat plane between this lesion and the posterior lower uterine segment. Differential considerations include primary rectal neoplasm versus lower uterine segment exophytic fibroid. Correlation with direct visualization is advised. 3. Multiple right ovary cysts measuring up to 4.8  cm. Follow-up ultrasound in 3 to 6 months advised. 4. Multiple uterine fibroids, including a 1.7 cm submucosal fibroid and a 2.5 cm subserosal fibroid.   [HD]  1552 Dr. Verdon called back and we reviewed the results.  She is attempting to get the patient into on gynecology clinic this Wednesday.  She is making arrangements.  Will update the patient with results [HD]  1600 Patient counseled on results.  I printed out the MRIs.  She understands the severity of the nature including the propensity of local metastases.  Agreeable to go home today we will likely send with pain medications once we have a plan [HD]  1658 Calling dr. Verdon back on plan [HD]  1719 Dr. Verdon will arrange for urgent gynecology oncology appointment for Wednesday.  Patient given return precautions.  Will send a small prescription of Percocet to the patient's pharmacy of choice along with MiraLAX .  All questions answered and patient's daughter will drive her home [HD]    Clinical Course User Index [HD] Nicholaus Rolland BRAVO, MD     -- Risk: 5 This patient has a high risk of morbidity due to further diagnostic testing or treatment. Rationale: This patient's evaluation and management involve a high risk of morbidity due to the potential severity of presenting symptoms, need for diagnostic testing, and/or initiation of treatment that may require close monitoring. The differential includes conditions with potential for significant deterioration or requiring escalation of care. Treatment decisions in the ED, including medication administration, procedural interventions, or disposition planning, reflect this level of risk. Additional Support: -- Drug therapy requiring intensive monitoring for toxicity [ ]  -- Decision regarding elective major surgery with idenitified patient or procedure risk factors [ ]  --  Decision regarding hospitalization or escalation of hospital-level care [ ]  -- Decision not to resuscitate or to de-escalate  care because of poor prognosis [ ]  -- Parental controlled substances [ ]   COPA: 5 The patient has a severe exacerbation, progression, or side effect of treatment of the following illness/illnesses: []  OR  The patient has the following acute or chronic illness/injury that poses a possible threat to life or bodily function: [X] : The patient has a potentially serious acute condition or an acute exacerbation of a chronic illness requiring urgent evaluation and management in the Emergency Department. The clinical presentation necessitates immediate consideration of life-threatening or function-threatening diagnoses, even if they are ultimately ruled out.  Data(2/3 categories following were performed): 5 I reviewed or ordered at least three unique tests, external notes, and/or the history required an independent historian as one of the three requirements as following: Daughter at bedside, CBC, BMP AND  I independently interpreted the following test: MRI of the pelvis OR  I discussed the management of the patient with the following external physician or qualified healthcare provider: OB/GYN on-call Dr. Verdon    Suggested E/M Coding Level: 5, [phone], This has been selected based on the 2022/03/05 CPT guidelines for E/M codes in the Emergency Department based on 2/3 of the CoPA, Data, and Risk.    Nicholaus Rolland BRAVO, MD 02/05/24 1726

## 2024-02-05 NOTE — ED Triage Notes (Signed)
 Pt states that she was evaluated prior and was told to return if she cont to have pain, pt reports 3 weeks of bilat lower abd pain. Denies vomiting or diarrhea. Lmp 2 weeks

## 2024-02-05 NOTE — Discharge Instructions (Signed)
 You were seen in the emergency department for 3 weeks of worsening pelvic pain.  You are found to have an ovarian mass that is concerning for cancer.  This needs to be followed up on within the next week.  Please use your pain medications for intractable pain.  Be aware that these can be constipating.  If you take these you should not drive or operate machinery.  On Monday morning please call the number and confirm that you have an appointment scheduled.  Return with any acutely worsening symptoms or any other emergency. -- RETURN PRECAUTIONS & AFTERCARE: (ENGLISH) RETURN PRECAUTIONS: Return immediately to the emergency department or see/call your doctor if you feel worse, weak or have changes in speech or vision, are short of breath, have fever, vomiting, pain, bleeding or dark stool, trouble urinating or any new issues. Return here or see/call your doctor if not improving as expected for your suspected condition. FOLLOW-UP CARE: Call your doctor and/or any doctors we referred you to for more advice and to make an appointment. Do this today, tomorrow or after the weekend. Some doctors only take PPO insurance so if you have HMO insurance you may want to contact your HMO or your regular doctor for referral to a specialist within your plan. Either way tell the doctor's office that it was a referral from the emergency department so you get the soonest possible appointment.  YOUR TEST RESULTS: Take result reports of any blood or urine tests, imaging tests and EKG's to your doctor and any referral doctor. Have any abnormal tests repeated. Your doctor or a referral doctor can let you know when this should be done. Also make sure your doctor contacts this hospital to get any test results that are not currently available such as cultures or special tests for infection and final imaging reports, which are often not available at the time you leave the ER but which may list additional important findings that are not  documented on the preliminary report. BLOOD PRESSURE: If your blood pressure was greater than 120/80 have your blood pressure rechecked within 1 to 2 weeks. MEDICATION SIDE EFFECTS: Do not drive, walk, bike, take the bus, etc. if you have received or are being prescribed any sedating medications such as those for pain or anxiety or certain antihistamines like Benadryl. If you have been give one of these here get a taxi home or have a friend drive you home. Ask your pharmacist to counsel you on potential side effects of any new medication

## 2024-02-08 ENCOUNTER — Ambulatory Visit

## 2024-02-10 ENCOUNTER — Inpatient Hospital Stay: Attending: Obstetrics and Gynecology | Admitting: Obstetrics and Gynecology

## 2024-02-10 ENCOUNTER — Inpatient Hospital Stay

## 2024-02-10 VITALS — BP 166/109 | HR 114 | Temp 98.7°F | Resp 20 | Wt 178.2 lb

## 2024-02-10 DIAGNOSIS — Z1501 Genetic susceptibility to malignant neoplasm of breast: Secondary | ICD-10-CM

## 2024-02-10 DIAGNOSIS — R102 Pelvic and perineal pain: Secondary | ICD-10-CM

## 2024-02-10 DIAGNOSIS — Z7189 Other specified counseling: Secondary | ICD-10-CM

## 2024-02-10 DIAGNOSIS — Z1509 Genetic susceptibility to other malignant neoplasm: Secondary | ICD-10-CM

## 2024-02-10 DIAGNOSIS — R1900 Intra-abdominal and pelvic swelling, mass and lump, unspecified site: Secondary | ICD-10-CM | POA: Insufficient documentation

## 2024-02-10 DIAGNOSIS — N83201 Unspecified ovarian cyst, right side: Secondary | ICD-10-CM

## 2024-02-10 NOTE — Progress Notes (Addendum)
 Gynecologic Oncology Consult Visit   Referring Provider: Dr Fredirick  Chief Complaint: Ovarian Mass with elevated ROMA  Subjective:  Patricia Yu is a 43 y.o. 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 Fredirick with history of fibroids, BRCA1 gene mutation, who presents for ovarian mass.   Patient presented to emergency room for 3 week history of lower abdominal pain.  No vaginal discharge symptoms. She is currently on her menstrual cycle.   01/16/2019 ultrasound FINDINGS: Uterus - Measurements: 9.7 x 5.6 x 7.0 cm = volume: 200 mL. The uterus demonstrates a heterogeneous echotexture with multiple fibroids with the largest measuring approximately 2.5 x 2.4 x 2.3 cm in the right body.   Endometrium - Thickness: 4 mm. The endometrium is poorly visualized and suboptimally evaluated.   Right ovary - Measurements: 2.5 x 1.6 x 2.3 cm = volume: 4.8 mL. Normal appearance/no adnexal mass.   Left ovary - Measurements: 2.6 x 1.9 x 1.9 cm = volume: 4.8 mL. There is a 4.8 x 3.6 x 5.5 cm left ovarian or paraovarian cyst. Additional cystic structure noted in the midline measuring 8.5 x 7.4 x 9.4 cm. - Pulsed Doppler evaluation of both ovaries demonstrates normal low-resistance arterial and venous waveforms.   Other findings - There is a 2.1 x 1.8 x 1.9 cm echogenic solid lesion in the cholestatic. Doppler images demonstrate presence of flow within this lesion. This is not characterized but may represent an endometrioma, a broad ligament fibroid, or other processes including neoplasm. Further evaluation with MRI without and with contrast is recommended.   IMPRESSION: 1. Heterogeneous uterus with multiple fibroids. 2. Cystic structures in the pelvis as well as a solid lesion in the cholestatic as above. Further evaluation with MRI without and with contrast recommended.  02/05/24- MR abdomen pelvis w wo contrast FINDINGS:   LIVER: No focal liver lesion. A trace of  perihepatic fluid overlying the inferior margin of the right lobe of the liver, image 18/15.   GALLBLADDER AND BILIARY SYSTEM: Gallbladder appears normal. No bile duct dilatation.   SPLEEN: The spleen appears within normal limits.   PANCREAS/PANCREATIC DUCT: No pancreatic inflammation, main duct dilatation, or mass.   ADRENAL GLANDS: There is a small nodule arising from the right adrenal gland measuring 1.1 x 0.5 cm, image 28/28. This has been stable since 02/03/2016 and most likely represents a benign adenoma. No follow-up imaging recommended.   KIDNEYS: Bilateral areas of cortical scarring noted. No cyst within the lateral cortex of the right kidney measures 1.3 cm, image 18/12. No follow-up imaging recommended. Chronic focal atrophy of the upper pole of the right kidney is unchanged compared with 02/03/2016. No signs of obstructive uropathy.   LYMPH NODES: No adenopathy.   VASCULATURE: Normal caliber of the abdominal aorta.   PERITONEUM: No ascites. A trace of perihepatic fluid overlying the inferior margin of the right lobe of the liver, image 18/15.   ABDOMINAL WALL: No hernia. No mass.   BOWEL: No dilated bowel loops identified within the abdomen or pelvis. The appendix is visualized and appears normal.   BONES: No suspicious enhancing bone lesions.   SOFT TISSUES: Unremarkable.   MISCELLANEOUS: Bilateral pleural effusions. Mild cardiac enlargement.   IMPRESSION: 1. No acute intra-abdominal abnormality related to the clinical history of pelvic mass and abdominal pain. 2. Stable right adrenal nodule, likely a benign adenoma. No follow-up imaging recommended. 3. Trace free fluid noted along the inferior margin of the right lobe of the liver.  Etiology indeterminate. 4. Trace bilateral pleural effusions and mild cardiac enlargement.  02/05/24- MR Pelvis W Wo Contrast FINDINGS:   URINARY TRACT : Partially decompressed urinary bladder shows no focal abnormality.    GI: There is an exophytic mass arising off the anterior wall of the rectum extending towards the cul-de-sac measuring 3.9 x 2.5 x 3.2 cm, axial image 42/4. There is loss of the fat plane between this lesion and the posterior lower uterine segment, image 15/6.   REPRODUCTIVE: Multiple uterine fibroids are identified. This includes: Submucosal fibroid arising off the posterior fundus measuring 1.7 cm, sagittal image 16/6. Subserosal fibroid arising off the anterior aspect of the right uterine fundus measures 2.5 cm, image 13/6. Nabothian cysts noted within the cervical canal.   Large exophytic complex cystic mass arising off the left ovary measures 10.1 x 7.8 x 8.5 cm. This contains peripheral areas of nodular enhancement along the anterior wall of the mass measuring 1.8 x 0.7 cm, image 22/4.   There are three cysts noted arising off the right ovary. The dominant cyst is uniformly T2 hyperintense measuring 4.8 x 3.9 cm, image 39/40. There are 2 additional cysts within anteriorly measuring 2.8 cm, image 31/40 and 2.4 cm, image 37/40. Both of these cysts show mural enhancement without internal septation or mural nodule. There is a small amount of free fluid noted surrounding the right ovary.   BONES: The visualized osseous structures are unremarkable. There is a fat-containing supraumbilical hernia along the midline of the anterior abdominal wall measuring 3.3 cm. Smaller fat-containing umbilical hernia measures 1.8 cm and also contains fat only.   IMPRESSION: 1. Large complex cystic mass arising off the left ovary measuring 10.1 x 7.8 x 8.5 cm, with peripheral nodular enhancement. 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 Recommend referral to gyn oncology for further management. 2. Exophytic mass arising off the anterior wall of the rectum extending towards the cul-de-sac measuring 3.9 x 2.5 x 3.2 cm, with loss of the fat plane between this lesion and the posterior lower uterine segment.  Differential considerations include primary rectal neoplasm versus lower uterine  segment exophytic fibroid. Correlation with direct visualization is advised. 3. Multiple right ovary cysts measuring up to 4.8 cm. Follow-up ultrasound in 3 to 6 months advised. 4. Multiple uterine fibroids, including a 1.7 cm submucosal fibroid and a 2.5 cm subserosal fibroid. 5. Fat-containing supraumbilical hernia (3.3 cm) and umbilical hernia (1.8 cm).   CA 125 - 41.1 HE4- 121 Premenopausal ROMA- 41.3%  09/18/23- NILM, HPV Negative  History of Tubal ligation. She has now completed child bearing. Her children are in their 20's.  She is on norethindrone  for fibroids or per patient - birth control.   Problem List: Patient Active Problem List   Diagnosis Date Noted   Right ovarian cyst 01/28/2024   Fibroid uterus 01/28/2024   Menorrhagia 01/28/2024   BRCA1 gene mutation positive 11/25/2019   Genetic testing 11/25/2019   Family history of BRCA1 gene positive    Family history of breast cancer    Past Medical History: Past Medical History:  Diagnosis Date   Anemia    BRCA1 gene mutation positive 11/25/2019   Family history of BRCA1 gene positive    Family history of breast cancer    Heavy periods    Ovarian cyst    STD (sexually transmitted disease) 01/2016   trich   Past Surgical History: Past Surgical History:  Procedure Laterality Date   TUBAL LIGATION     Past Gynecologic  History:  As per interval history  OB History:  OB History  Gravida Para Term Preterm AB Living  3 3 3   3   SAB IAB Ectopic Multiple Live Births      3    # Outcome Date GA Lbr Len/2nd Weight Sex Type Anes PTL Lv  3 Term 2003   6 lb 2.4 oz (2.79 kg) F Vag-Spont   LIV  2 Term 2000   6 lb (2.722 kg) F Vag-Spont   LIV  1 Term 1999   9 lb 2.4 oz (4.15 kg) M Vag-Spont   LIV    Family History: Family History  Problem Relation Age of Onset   Breast cancer Mother 49       diagnosed in other breast age 23   Heart  disease Mother    BRCA 1/2 Mother    Heart disease Father    Cancer Brother 4       unknown type   Breast cancer Maternal Aunt        diagnosed younger than 18   Cancer Maternal Uncle        unknown type   Breast cancer Maternal Grandmother    Cancer Maternal Great-grandmother        unknown type   Ovarian cancer Neg Hx    Colon cancer Neg Hx    Diabetes Neg Hx     Social History: Social History   Socioeconomic History   Marital status: Single    Spouse name: Not on file   Number of children: Not on file   Years of education: Not on file   Highest education level: Not on file  Occupational History   Not on file  Tobacco Use   Smoking status: Never   Smokeless tobacco: Never  Vaping Use   Vaping status: Never Used  Substance and Sexual Activity   Alcohol use: No   Drug use: No   Sexual activity: Yes    Partners: Male    Birth control/protection: Surgical  Other Topics Concern   Not on file  Social History Narrative   Not on file   Social Drivers of Health   Financial Resource Strain: Not on file  Food Insecurity: No Food Insecurity (02/10/2024)   Hunger Vital Sign    Worried About Running Out of Food in the Last Year: Never true    Ran Out of Food in the Last Year: Never true  Transportation Needs: No Transportation Needs (02/10/2024)   PRAPARE - Administrator, Civil Service (Medical): No    Lack of Transportation (Non-Medical): No  Physical Activity: Not on file  Stress: Not on file  Social Connections: Not on file  Intimate Partner Violence: Not At Risk (02/10/2024)   Humiliation, Afraid, Rape, and Kick questionnaire    Fear of Current or Ex-Partner: No    Emotionally Abused: No    Physically Abused: No    Sexually Abused: No    Allergies: No Known Allergies  Current Medications: Current Outpatient Medications  Medication Sig Dispense Refill   acetaminophen  (TYLENOL  8 HOUR) 650 MG CR tablet TYLENOL  8 HOUR ARTHRITIS PAIN 650 MG CR-TABS      hydrocortisone  cream 1 % Apply 1 application topically 2 (two) times daily. 30 g 0   meloxicam  (MOBIC ) 15 MG tablet MELOXICAM  15 MG TABS     norethindrone  (MICRONOR ) 0.35 MG tablet Take 1 tablet (0.35 mg total) by mouth daily. 84 tablet 4   ondansetron  (ZOFRAN -ODT) 4  MG disintegrating tablet ONDANSETRON  4 MG TBDP     polyethylene glycol (MIRALAX ) 17 g packet Take 17 g by mouth daily. 14 each 0   traMADol (ULTRAM) 50 MG tablet TRAMADOL HCL 50 MG TABS     oxyCODONE -acetaminophen  (PERCOCET) 5-325 MG tablet Take 1 tablet by mouth every 4 (four) hours as needed for up to 3 days for severe pain (pain score 7-10). (Patient not taking: Reported on 02/10/2024) 10 tablet 0   No current facility-administered medications for this visit.    Review of Systems General: negative for fevers, changes in weight or night sweats Skin: negative for changes in moles or sores or rash Eyes: negative for changes in vision HEENT: negative for change in hearing, tinnitus, voice changes Pulmonary: negative for dyspnea, orthopnea, productive cough, wheezing Cardiac: negative for palpitations, pain Gastrointestinal: negative for nausea, vomiting, constipation, diarrhea, hematemesis, hematochezia Genitourinary/Sexual: negative for dysuria, retention, hematuria, incontinence Ob/Gyn:  Positive pelvic pain for 3 weeks Musculoskeletal: negative for pain, joint pain, back pain Hematology: negative for easy bruising, abnormal bleeding Neurologic/Psych: negative for headaches, seizures, paralysis, weakness, numbness  Objective:  Physical Examination:  BP (!) 166/109 Comment: Patient is extermely nervous.  Pulse (!) 114   Temp 98.7 F (37.1 C)   Resp 20   Wt 178 lb 3.2 oz (80.8 kg)   LMP 01/11/2024 (Exact Date)   SpO2 100%   BMI 31.57 kg/m   Repeat BP 166/105    GENERAL: Patient is a well appearing female in no acute distress HEENT:  PERRL, neck supple with midline trachea. Thyroid without masses.  NODES:  No  cervical, supraclavicular, axillary, or inguinal lymphadenopathy palpated.  LUNGS:  Clear to auscultation bilaterally.  No wheezes or rhonchi. HEART:  Regular rate and rhythm. No murmur appreciated. ABDOMEN:  Soft, nontender. Firmness in the upper abdomen above the umbilicus. No ascites or definite masses.   Back: No CVAT EXTREMITIES:  No peripheral edema.   SKIN:  Clear with no obvious rashes or skin changes. No nail dyscrasia. NEURO:  Nonfocal. Well oriented.  Appropriate affect.  Pelvic: EGBUS: no lesions Cervix: Unable to evaluate deviated anteriorly and to the left from the mass and constipation. Due to inability to see cervix, EMBx biopsy not performed.  Vagina: no lesions, no discharge or bleeding Uterus: non enlarged nontender, unable to assess mobile Adnexa: masses on the right unable to determine size Rectovaginal: confirmatory, no mucosal involvement  Lab Review Lab Results  Component Value Date   WBC 4.8 02/05/2024   HGB 11.9 (L) 02/05/2024   HCT 37.1 02/05/2024   MCV 80.3 02/05/2024   PLT 298 02/05/2024   Lab Results  Component Value Date   ALT 11 02/05/2024   AST 16 02/05/2024   ALKPHOS 50 02/05/2024   BILITOT 0.9 02/05/2024    Lab Results  Component Value Date   NA 137 02/05/2024   CL 109 02/05/2024   K 3.5 02/05/2024   CO2 21 (L) 02/05/2024   BUN 16 02/05/2024   CREATININE 0.97 02/05/2024   GFRNONAA >60 02/05/2024   CALCIUM 9.3 02/05/2024   ALBUMIN 3.6 02/05/2024   GLUCOSE 104 (H) 02/05/2024   Lab Results  Component Value Date   LIPASE 29 02/05/2024   Lab Results  Component Value Date   COLORU dark yellow 02/14/2016   CLARITYU clear 02/14/2016   GLUCOSEUR neg 02/14/2016   BILIRUBINUR NEGATIVE 02/05/2024   KETONESU neg 02/14/2016   SPECGRAV 1.020 02/14/2016   RBCUR small 02/14/2016   PHUR 6.0 02/14/2016  PROTEINUR NEGATIVE 02/05/2024   UROBILINOGEN 0.2 02/14/2016   LEUKOCYTESUR NEGATIVE 02/05/2024     Radiologic Imaging: As per HPI     Assessment:  GERTURDE KUBA is a 43 y.o. 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   Asymptomatic diastolic hypertension and tachycardia   Plan:   Problem List Items Addressed This Visit       Endocrine   Right ovarian cyst - Primary   Relevant Orders   DG Chest 2 View   EKG 12-Lead     Other   BRCA1 gene mutation positive   Relevant Orders   DG Chest 2 View   EKG 12-Lead     We discussed the findings and her symptoms. We recommend surgical management and given the findings recommend surgery at [facility]. Plan for CXR and EKG at [facility]   Follow up at 02/16/2024 for Preop at [facility]; Surgery 02/26/2024 with Dr. Laymon Bunde for robotic TLH BSO possible staging including pelvic and para-aortic, possible rectosigmoid resection. We discussed if the intraoperative frozen section is negative for malignancy she will not need staging. There is the possible option of unilateral ovarian preservation if the frozen is negative with delayed oophorectomy. However, given this experience she would prefer to have definitive surgery. We will review the options again at her preop visit at [facility] including further discussion of hysterectomy and all the risks.   She will check BPs at home. She is very nervous and concerned today. Our team, Rayfield Jasmine, RN will call her in two days to check on her pressures and we will determine subsequent management based on these findings. She does not have concerning symptoms at this time. We discussed that if she should get a headache, chest pain, or stroke-like symptoms that would be an indicate to seek urgent medical care.   Suggested return to clinic in  2 - 4 weeks after her surgery.    The patient's diagnosis, an outline of the further diagnostic and laboratory studies which will be required, the recommendation for surgery, and alternatives were discussed with her and her accompanying family  members.  All questions were answered to their satisfaction.  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   I personally had a face to face interaction and evaluated the patient jointly with the NP, Ms. Tinnie Dawn.  I have reviewed her history and available records and have performed the key portions of the physical exam including lymph node survey, abdominal exam, pelvic exam with my findings confirming those documented above by the APP.  I have discussed the case with the APP and the patient.  I agree with the above documentation, assessment and plan which was fully formulated by me.  Counseling was completed by me.   I personally saw the patient and performed a substantive portion of this encounter in conjunction with the listed APP as documented above.  Angeles Isidor Constable, MD       CC:  Glenys GORMAN Birk, MD [address] [place],  [place] [address] [phone]

## 2024-02-11 ENCOUNTER — Telehealth: Payer: Self-pay

## 2024-02-11 ENCOUNTER — Ambulatory Visit
Admission: RE | Admit: 2024-02-11 | Discharge: 2024-02-11 | Disposition: A | Discharge status: Home / Self Care | Source: Ambulatory Visit | Attending: Obstetrics and Gynecology | Admitting: Obstetrics and Gynecology

## 2024-02-11 ENCOUNTER — Ambulatory Visit

## 2024-02-11 DIAGNOSIS — N83201 Unspecified ovarian cyst, right side: Secondary | ICD-10-CM

## 2024-02-11 DIAGNOSIS — Z1509 Genetic susceptibility to other malignant neoplasm: Secondary | ICD-10-CM | POA: Diagnosis not present

## 2024-02-11 DIAGNOSIS — Z01810 Encounter for preprocedural cardiovascular examination: Secondary | ICD-10-CM | POA: Diagnosis present

## 2024-02-11 DIAGNOSIS — Z1501 Genetic susceptibility to malignant neoplasm of breast: Secondary | ICD-10-CM

## 2024-02-11 DIAGNOSIS — Z01818 Encounter for other preprocedural examination: Secondary | ICD-10-CM | POA: Diagnosis not present

## 2024-02-11 NOTE — Telephone Encounter (Signed)
 I reached out to Patricia Yu today for her upcoming new patient referral appointment with Dr.Jackson-Moore. Patricia Yu reports being seen by Dr.Secord yesterday 8/20. She requested cancellation of appointment on 8/27.   Appointment cancelled.

## 2024-02-12 ENCOUNTER — Telehealth: Payer: Self-pay

## 2024-02-12 NOTE — Telephone Encounter (Signed)
 Called to follow up on BP. BP reading from 8/21, 144/95. I have asked her to notify her PCP regarding any optimization she may need in regards to her upcoming surgery. CXR and EKG completed.

## 2024-02-15 ENCOUNTER — Ambulatory Visit: Payer: Self-pay | Admitting: Obstetrics and Gynecology

## 2024-02-16 DIAGNOSIS — Z6830 Body mass index (BMI) 30.0-30.9, adult: Secondary | ICD-10-CM | POA: Insufficient documentation

## 2024-02-16 DIAGNOSIS — Z01818 Encounter for other preprocedural examination: Secondary | ICD-10-CM | POA: Insufficient documentation

## 2024-02-17 ENCOUNTER — Inpatient Hospital Stay: Admitting: Obstetrics & Gynecology

## 2024-03-02 ENCOUNTER — Encounter: Payer: Self-pay | Admitting: Obstetrics and Gynecology

## 2024-03-02 ENCOUNTER — Other Ambulatory Visit: Payer: Self-pay

## 2024-03-02 DIAGNOSIS — C569 Malignant neoplasm of unspecified ovary: Secondary | ICD-10-CM

## 2024-03-02 NOTE — Progress Notes (Unsigned)
 I called Patricia Yu to review her surgical course. I reviewed the operative report and we are still waiting final pathology. She has a postop appointment with Dr. Kristene. Pelvic MRI scheduled for 03/24/2024. Once we have all the information we will arrange for her Med Oncology consultation at [facility] and follow up here.  Jeydi Klingel Isidor Constable, MD

## 2024-03-10 ENCOUNTER — Other Ambulatory Visit: Admitting: Family Medicine

## 2024-03-16 ENCOUNTER — Inpatient Hospital Stay: Attending: Obstetrics and Gynecology | Admitting: Obstetrics and Gynecology

## 2024-03-16 VITALS — BP 142/100 | HR 115 | Temp 98.6°F | Resp 20 | Wt 171.1 lb

## 2024-03-16 DIAGNOSIS — Z90710 Acquired absence of both cervix and uterus: Secondary | ICD-10-CM | POA: Diagnosis not present

## 2024-03-16 DIAGNOSIS — Z1501 Genetic susceptibility to malignant neoplasm of breast: Secondary | ICD-10-CM | POA: Insufficient documentation

## 2024-03-16 DIAGNOSIS — Z86018 Personal history of other benign neoplasm: Secondary | ICD-10-CM | POA: Insufficient documentation

## 2024-03-16 DIAGNOSIS — Z9079 Acquired absence of other genital organ(s): Secondary | ICD-10-CM | POA: Insufficient documentation

## 2024-03-16 DIAGNOSIS — Z803 Family history of malignant neoplasm of breast: Secondary | ICD-10-CM | POA: Insufficient documentation

## 2024-03-16 DIAGNOSIS — Z1509 Genetic susceptibility to other malignant neoplasm: Secondary | ICD-10-CM | POA: Diagnosis not present

## 2024-03-16 DIAGNOSIS — C5701 Malignant neoplasm of right fallopian tube: Secondary | ICD-10-CM | POA: Diagnosis present

## 2024-03-16 DIAGNOSIS — Z7189 Other specified counseling: Secondary | ICD-10-CM

## 2024-03-16 DIAGNOSIS — Z148 Genetic carrier of other disease: Secondary | ICD-10-CM | POA: Insufficient documentation

## 2024-03-16 DIAGNOSIS — C541 Malignant neoplasm of endometrium: Secondary | ICD-10-CM | POA: Diagnosis not present

## 2024-03-16 DIAGNOSIS — Z90722 Acquired absence of ovaries, bilateral: Secondary | ICD-10-CM | POA: Insufficient documentation

## 2024-03-16 DIAGNOSIS — Z809 Family history of malignant neoplasm, unspecified: Secondary | ICD-10-CM | POA: Insufficient documentation

## 2024-03-16 DIAGNOSIS — Z8481 Family history of carrier of genetic disease: Secondary | ICD-10-CM | POA: Insufficient documentation

## 2024-03-16 DIAGNOSIS — Z1502 Genetic susceptibility to malignant neoplasm of ovary: Secondary | ICD-10-CM | POA: Insufficient documentation

## 2024-03-16 NOTE — Progress Notes (Signed)
 Gynecologic Oncology Interval Visit   Referring Provider: Dr Patricia Yu  Chief Complaint: Ovarian Mass with elevated ROMA  Subjective:  Patricia Yu is a 43 y.o. 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 Fredirick with history of fibroids, BRCA1 gene mutation, presented for ovarian mass now s/p TLH-BSO with mapping & biopsies, and prostosigmoidoscopy, rectal biopsy, with Patricia Yu at [facility] on 02/26/24, who returns to clinic for post op visit, discussion of results, and treatment planning.   During surgery, Patricia Yu appreciated an exophytic mass arising off the anterior wall of the rectum extending towards the cul de sac measuring 4 cm with loss of fat plane between the lesion and the posterior uterine segment. Mass could be palpated on rectal exam.   Patricia Yu noted: Firmness on the anterior wall of the rectum approximately 6cm from the anal verge, no obvious intraluminal mass on colonoscopy but some mucosal irregularity (biopsied)   02/26/24- Pathology:  DIAGNOSIS   A.  Right ovary and fallopian tube, salpingo-oophorectomy: Fallopian tube: Positive for high grade serous carcinoma, see comment. Ovary: Cystic follicles, negative for malignancy.    B.  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 Fallopian tube: No definite fallopian tube identified.   C. Left pelvic lymph nodes, excision: Three lymph nodes, negative for malignancy (0/3).   D. Omentum, resection: Benign fibroadipose tissue. Negative for malignancy.    E. Rectal mass, biopsy: Benign colorectal mucosa with no significant histopathological change.   F.  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 Myometrial invasion: Absent. Lymphatic/vascular invasion: Absent.  Cervix: Negative for malignancy. Serosa: Negative for malignancy.   Additional  findings: Myometrium: Leiomyomata (up to 3.0 cm).   Portion of left fallopian tube: Negative for malignancy.   See synoptic report for additional details.    p53 immunohistochemistry: Over expression (mutant pattern)   Mismatch repair (MMR) protein and microsatellite instability (MSI), Her2/neu, ER and PR:  Pending, to be reported separately from the molecular diagnostics laboratory.   G. Right pelvic lymph nodes, excision: Seven lymph nodes, negative for malignancy (0/7).   H. Right para-aortic lymph nodes, excision: Three lymph nodes, negative for malignancy (0/3).   I. Right peritoneum gutter, biopsy: Benign fibroadipose tissue.   J. Left peritoneum gutter, biopsy:  Benign fibroadipose tissue.     She has pelvic MRI to be performed at [facility] due to rectal findings which was incompletely captured on previous imaging.        Gynecologic Oncology History:  Patient presented to emergency room for 3 week history of lower abdominal pain.  No vaginal discharge symptoms. She is currently on her menstrual cycle.   01/16/2019 ultrasound FINDINGS: Uterus - Measurements: 9.7 x 5.6 x 7.0 cm = volume: 200 mL. The uterus demonstrates a heterogeneous echotexture with multiple fibroids with the largest measuring approximately 2.5 x 2.4 x 2.3 cm in the right body.   Endometrium - Thickness: 4 mm. The endometrium is poorly visualized and suboptimally evaluated.   Right ovary - Measurements: 2.5 x 1.6 x 2.3 cm = volume: 4.8 mL. Normal appearance/no adnexal mass.   Left ovary - Measurements: 2.6 x 1.9 x 1.9 cm = volume: 4.8 mL. There is a 4.8 x 3.6 x 5.5 cm left ovarian or paraovarian cyst. Additional cystic structure noted in the midline measuring 8.5 x 7.4 x 9.4 cm. - Pulsed Doppler evaluation of both ovaries demonstrates normal  low-resistance arterial and venous waveforms.   Other findings - There is a 2.1 x 1.8 x 1.9 cm echogenic solid lesion in the cholestatic. Doppler images  demonstrate presence of flow within this lesion. This is not characterized but may represent an endometrioma, a broad ligament fibroid, or other processes including neoplasm. Further evaluation with MRI without and with contrast is recommended.   IMPRESSION: 1. Heterogeneous uterus with multiple fibroids. 2. Cystic structures in the pelvis as well as a solid lesion in the cholestatic as above. Further evaluation with MRI without and with contrast recommended.  02/05/24- MR abdomen pelvis w wo contrast FINDINGS:   LIVER: No focal liver lesion. A trace of perihepatic fluid overlying the inferior margin of the right lobe of the liver, image 18/15.   GALLBLADDER AND BILIARY SYSTEM: Gallbladder appears normal. No bile duct dilatation.   SPLEEN: The spleen appears within normal limits.   PANCREAS/PANCREATIC DUCT: No pancreatic inflammation, main duct dilatation, or mass.   ADRENAL GLANDS: There is a small nodule arising from the right adrenal gland measuring 1.1 x 0.5 cm, image 28/28. This has been stable since 02/03/2016 and most likely represents a benign adenoma. No follow-up imaging recommended.   KIDNEYS: Bilateral areas of cortical scarring noted. No cyst within the lateral cortex of the right kidney measures 1.3 cm, image 18/12. No follow-up imaging recommended. Chronic focal atrophy of the upper pole of the right kidney is unchanged compared with 02/03/2016. No signs of obstructive uropathy.   LYMPH NODES: No adenopathy.   VASCULATURE: Normal caliber of the abdominal aorta.   PERITONEUM: No ascites. A trace of perihepatic fluid overlying the inferior margin of the right lobe of the liver, image 18/15.   ABDOMINAL WALL: No hernia. No mass.   BOWEL: No dilated bowel loops identified within the abdomen or pelvis. The appendix is visualized and appears normal.   BONES: No suspicious enhancing bone lesions.   SOFT TISSUES: Unremarkable.   MISCELLANEOUS: Bilateral pleural  effusions. Mild cardiac enlargement.   IMPRESSION: 1. No acute intra-abdominal abnormality related to the clinical history of pelvic mass and abdominal pain. 2. Stable right adrenal nodule, likely a benign adenoma. No follow-up imaging recommended. 3. Trace free fluid noted along the inferior margin of the right lobe of the liver. Etiology indeterminate. 4. Trace bilateral pleural effusions and mild cardiac enlargement.  02/05/24- MR Pelvis W Wo Contrast FINDINGS:   URINARY TRACT : Partially decompressed urinary bladder shows no focal abnormality.   GI: There is an exophytic mass arising off the anterior wall of the rectum extending towards the cul-de-sac measuring 3.9 x 2.5 x 3.2 cm, axial image 42/4. There is loss of the fat plane between this lesion and the posterior lower uterine segment, image 15/6.   REPRODUCTIVE: Multiple uterine fibroids are identified. This includes: Submucosal fibroid arising off the posterior fundus measuring 1.7 cm, sagittal image 16/6. Subserosal fibroid arising off the anterior aspect of the right uterine fundus measures 2.5 cm, image 13/6. Nabothian cysts noted within the cervical canal.   Large exophytic complex cystic mass arising off the left ovary measures 10.1 x 7.8 x 8.5 cm. This contains peripheral areas of nodular enhancement along the anterior wall of the mass measuring 1.8 x 0.7 cm, image 22/4.   There are three cysts noted arising off the right ovary. The dominant cyst is uniformly T2 hyperintense measuring 4.8 x 3.9 cm, image 39/40. There are 2 additional cysts within anteriorly measuring 2.8 cm, image 31/40 and 2.4 cm, image  37/40. Both of these cysts show mural enhancement without internal septation or mural nodule. There is a small amount of free fluid noted surrounding the right ovary.   BONES: The visualized osseous structures are unremarkable. There is a fat-containing supraumbilical hernia along the midline of the anterior abdominal wall  measuring 3.3 cm. Smaller fat-containing umbilical hernia measures 1.8 cm and also contains fat only.   IMPRESSION: 1. Large complex cystic mass arising off the left ovary measuring 10.1 x 7.8 x 8.5 cm, with peripheral nodular enhancement. 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 Recommend referral to gyn oncology for further management. 2. Exophytic mass arising off the anterior wall of the rectum extending towards the cul-de-sac measuring 3.9 x 2.5 x 3.2 cm, with loss of the fat plane between this lesion and the posterior lower uterine segment. Differential considerations include primary rectal neoplasm versus lower uterine  segment exophytic fibroid. Correlation with direct visualization is advised. 3. Multiple right ovary cysts measuring up to 4.8 cm. Follow-up ultrasound in 3 to 6 months advised. 4. Multiple uterine fibroids, including a 1.7 cm submucosal fibroid and a 2.5 cm subserosal fibroid. 5. Fat-containing supraumbilical hernia (3.3 cm) and umbilical hernia (1.8 cm).  CA 125 - 41.1 HE4- 121 Premenopausal ROMA- 41.3%  09/18/23- NILM, HPV Negative  History of Tubal ligation. She has now completed child bearing. Her children are in their 20's.  She is on norethindrone  for fibroids or per patient - birth control.   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   Grief reaction 12/04/2023   Hyperpigmentation 12/04/2023   BRCA2 genetic carrier 12/04/2023   BRCA1 gene mutation positive 11/25/2019   Genetic testing 11/25/2019   Family history of BRCA1 gene  positive    Family history of breast cancer    Otitis media 01/04/2013   Breast mass 12/14/2012   Past Medical History: Past Medical History:  Diagnosis Date   Anemia    BRCA1 gene mutation positive 11/25/2019   Family history of BRCA1 gene positive    Family history of breast cancer    Heavy periods    Ovarian cyst    STD (sexually transmitted disease) 01/2016   trich   Past Surgical History: Past Surgical History:  Procedure Laterality Date   TUBAL LIGATION     Past Gynecologic History:  As per interval history  OB History:  OB History  Gravida Para Term Preterm AB Living  3 3 3   3   SAB IAB Ectopic Multiple Live Births      3    # Outcome Date GA Lbr Len/2nd Weight Sex Type Anes PTL Lv  3 Term 2003   6 lb 2.4 oz (2.79 kg) F Vag-Spont   LIV  2 Term 2000   6 lb (2.722 kg) F Vag-Spont   LIV  1 Term 1999   9 lb 2.4 oz (4.15 kg) M Vag-Spont   LIV   Family History: Family History  Problem Relation Age of Onset   Breast cancer Mother 57       diagnosed in other  breast age 98   Heart disease Mother    BRCA 1/2 Mother    Heart disease Father    Cancer Brother 41       unknown type   Breast cancer Maternal Aunt        diagnosed younger than 65   Cancer Maternal Uncle        unknown type   Breast cancer Maternal Grandmother    Cancer Maternal Great-grandmother        unknown type   Ovarian cancer Neg Hx    Colon cancer Neg Hx    Diabetes Neg Hx    Social History: Social History   Socioeconomic History   Marital status: Single    Spouse name: Not on file   Number of children: Not on file   Years of education: Not on file   Highest education level: Not on file  Occupational History   Not on file  Tobacco Use   Smoking status: Never   Smokeless tobacco: Never  Vaping Use   Vaping status: Never Used  Substance and Sexual Activity   Alcohol use: No   Drug use: No   Sexual activity: Yes    Partners: Male    Birth control/protection: Surgical  Other  Topics Concern   Not on file  Social History Narrative   Not on file   Social Drivers of Health   Financial Resource Strain: Not on file  Food Insecurity: No Food Insecurity (02/10/2024)   Hunger Vital Sign    Worried About Running Out of Food in the Last Year: Never true    Ran Out of Food in the Last Year: Never true  Transportation Needs: No Transportation Needs (02/10/2024)   PRAPARE - Administrator, Civil Service (Medical): No    Lack of Transportation (Non-Medical): No  Physical Activity: Not on file  Stress: Not on file  Social Connections: Not on file  Intimate Partner Violence: Not At Risk (02/10/2024)   Humiliation, Afraid, Rape, and Kick questionnaire    Fear of Current or Ex-Partner: No    Emotionally Abused: No    Physically Abused: No    Sexually Abused: No   Allergies: Allergies  Allergen Reactions   Oxycodone  Nausea Only   Current Medications: Current Outpatient Medications  Medication Sig Dispense Refill   amLODipine (NORVASC) 2.5 MG tablet AMLODIPINE BESYLATE 2.5 MG TABS     clonazePAM (KLONOPIN) 0.5 MG tablet Take 0.5 mg by mouth.     ibuprofen  (ADVIL ) 600 MG tablet Take by mouth.     metroNIDAZOLE  (FLAGYL ) 500 MG tablet Take by mouth.     neomycin  (MYCIFRADIN ) 500 MG tablet 2 tablets at 3pm. 2 tablets at 4 pm. 2 tablets at 10 pm prior to surgery     acetaminophen  (TYLENOL  8 HOUR) 650 MG CR tablet TYLENOL  8 HOUR ARTHRITIS PAIN 650 MG CR-TABS     amLODipine (NORVASC) 5 MG tablet Take 5 mg by mouth.     busPIRone (BUSPAR) 7.5 MG tablet Take 7.5 mg by mouth 2 (two) times daily.     hydrocortisone  cream 1 % Apply 1 application topically 2 (two) times daily. 30 g 0   meloxicam  (MOBIC ) 15 MG tablet MELOXICAM  15 MG TABS     norethindrone  (MICRONOR ) 0.35 MG tablet Take 1 tablet (0.35 mg total) by mouth daily. 84 tablet 4   ondansetron  (ZOFRAN -ODT) 4 MG disintegrating tablet ONDANSETRON  4 MG TBDP     polyethylene glycol (MIRALAX ) 17 g packet  Take 17 g  by mouth daily. 14 each 0   traMADol (ULTRAM) 50 MG tablet TRAMADOL HCL 50 MG TABS     No current facility-administered medications for this visit.    Review of Systems General:  no complaints Skin: no complaints Eyes: no complaints HEENT: no complaints Breasts: no complaints Pulmonary: no complaints Cardiac: no complaints Gastrointestinal: no complaints Genitourinary/Sexual: no complaints Ob/Gyn: no complaints Musculoskeletal: no complaints Hematology: no complaints Neurologic/Psych: no complaints   Objective:  Physical Examination:  BP (!) 142/100   Pulse (!) 115   Temp 98.6 F (37 C)   Resp 20   Wt 171 lb 1.6 oz (77.6 kg)   SpO2 100%   BMI 30.31 kg/m     GENERAL: Patient is a well appearing female in no acute distress HEENT:  Atraumatic normocephalic LUNGS:  normal respiratory effort ABDOMEN:  Soft, nontender. Nondistended. No ascites or masses.  Incisions: all well healed.  EXTREMITIES:  No peripheral edema.   NEURO:  Nonfocal. Well oriented.  Appropriate affect.  Pelvic: deferred  Lab Review No labs on site today  Radiologic Imaging: Awaiting MRI     Assessment:  Patricia Yu is a 43 y.o. female diagnosed BRCA1 mutation, stage IIIA serous endometrial cancer.   Rectal mass.   Asymptomatic diastolic hypertension and tachycardia  Plan:   Problem List Items Addressed This Visit       Other   BRCA1 gene mutation positive - Primary   Relevant Orders   Ambulatory Referral to [facility]   Other Visit Diagnoses       Endometrial cancer [facility])         The patient's diagnosis, and review of pathology was performed. We are waiting for her tumor markers to determine our treatment recommendations. We are also waiting for Patricia Yu review. I spoke with the Patricia Pathologist and we hope to have the tumor markers within a week. We requested that HER2 be expedited. She may be eligible for GY026 if HER2 is overexpressed. If dMMR then consider  adding IO. If pMMR and HER2 not overexpressed than chemotherapy alone.  All questions were answered to their satisfaction.  Patricia Dawn, NP scribed this note. I personally saw the patient and performed the other portion of this encounter in conjunction, the exam, review of history and pathology, and recommendations.   I will also speak with Patricia. Laymon Yu regarding the MRI results.   Patricia Jalloh Isidor Constable, MD    CC:  Patricia GORMAN Birk, MD [address] [place],  [place] [address] [phone]

## 2024-03-16 NOTE — Progress Notes (Signed)
 Patient has enrolled in [facility] services and is scheduled for an initial behavioral health evaluation on 03/28/2024.

## 2024-04-05 NOTE — Progress Notes (Signed)
 Patient was informed about the services provided by [facility] and understands that their participation in these services is voluntary. Patient understands that [facility] is billed through their oncologist practice. Patient provided informed consent to receive the services offered by [facility]. This care plan reflects the patient's initial presentation and the information available at the time of intake. It is intended as a starting point for care planning and may be revised as additional information is gathered or as the patient's needs and goals evolve. Final Intake Summary Member is a 43 year old diagnosed with Uterine cancer in September of this year. She had a hysterectomy on February 26, 2024 and is still off work for recovery. She works as at an Scientist, [employer] and plans to go back after she starts chemo and sees how it will affect her. She is currently living with her long term boyfriend and youngest daughter. She has 4 children, 3 of which are living on their own. She is scheduled to begin Chemo on the 16th of October. She has some fear over that. Processed those fears with [facility]. She has some emotional moments that she attributes to her hormonal fluctuations due to the hysterectomy. Denies any depression. She states that ever since she was diagnosed with cancer, she has had high anxiety before she goes to the doctor because she fears more bad news. Her PCP has prescribed her Clonazepam that she only takes on days when she has an appointment. She is not interested in a medication rec at this time. She loves traveling and spending time with family. She takes walks around her neighborhood when she feels up to it for exercise and strives to eat healthy. Her boyfriend is very supportive of her. He takes her to all of her appointments. Discussed resources such as cleaning for a reason, lasagna love, and the [employer]. Member asked that these resources be sent to her  email. PHQ-9: 0 Date: Apr 05 2024 Client: Patricia Yu Gender: Female Age: 43 DOB: Jun 30, 1980 [email or web address] Provider: Fortunato Goodell, [facility] [address] [place], [address] [place] [address] GAD-7: 6 Goals: journaling, eat healthy

## 2024-10-07 ENCOUNTER — Ambulatory Visit: Admitting: Family Medicine
